# Patient Record
Sex: Female | Born: 1975 | Hispanic: Yes | Marital: Married | Smoking: Never smoker
Health system: Southern US, Community
[De-identification: ages and names within clinical notes are randomized; demographics above are authoritative.]

## PROBLEM LIST (undated history)

## (undated) ENCOUNTER — Inpatient Hospital Stay (HOSPITAL_COMMUNITY): Payer: Self-pay

## (undated) DIAGNOSIS — O139 Gestational [pregnancy-induced] hypertension without significant proteinuria, unspecified trimester: Secondary | ICD-10-CM

## (undated) DIAGNOSIS — Z789 Other specified health status: Secondary | ICD-10-CM

## (undated) DIAGNOSIS — O039 Complete or unspecified spontaneous abortion without complication: Secondary | ICD-10-CM

## (undated) HISTORY — DX: Gestational (pregnancy-induced) hypertension without significant proteinuria, unspecified trimester: O13.9

## (undated) HISTORY — DX: Complete or unspecified spontaneous abortion without complication: O03.9

## (undated) HISTORY — PX: DILATION AND EVACUATION: SHX1459

---

## 1999-01-26 DIAGNOSIS — O139 Gestational [pregnancy-induced] hypertension without significant proteinuria, unspecified trimester: Secondary | ICD-10-CM

## 2012-09-03 ENCOUNTER — Ambulatory Visit: Payer: Self-pay | Admitting: Gynecology

## 2017-06-08 ENCOUNTER — Encounter (HOSPITAL_COMMUNITY): Payer: Self-pay | Admitting: *Deleted

## 2017-06-08 ENCOUNTER — Other Ambulatory Visit: Payer: Self-pay

## 2017-06-08 ENCOUNTER — Emergency Department (HOSPITAL_COMMUNITY)
Admission: EM | Admit: 2017-06-08 | Discharge: 2017-06-09 | Disposition: A | Discharge status: Home / Self Care | Payer: Self-pay | Attending: Emergency Medicine | Admitting: Emergency Medicine

## 2017-06-08 DIAGNOSIS — Z3A01 Less than 8 weeks gestation of pregnancy: Secondary | ICD-10-CM | POA: Insufficient documentation

## 2017-06-08 DIAGNOSIS — R1030 Lower abdominal pain, unspecified: Secondary | ICD-10-CM | POA: Insufficient documentation

## 2017-06-08 DIAGNOSIS — O26891 Other specified pregnancy related conditions, first trimester: Secondary | ICD-10-CM | POA: Insufficient documentation

## 2017-06-08 DIAGNOSIS — R109 Unspecified abdominal pain: Secondary | ICD-10-CM

## 2017-06-08 LAB — CBC
HEMATOCRIT: 39.1 % (ref 36.0–46.0)
Hemoglobin: 12.9 g/dL (ref 12.0–15.0)
MCH: 30.2 pg (ref 26.0–34.0)
MCHC: 33 g/dL (ref 30.0–36.0)
MCV: 91.6 fL (ref 78.0–100.0)
Platelets: 290 10*3/uL (ref 150–400)
RBC: 4.27 MIL/uL (ref 3.87–5.11)
RDW: 13.7 % (ref 11.5–15.5)
WBC: 11.3 10*3/uL — ABNORMAL HIGH (ref 4.0–10.5)

## 2017-06-08 LAB — URINALYSIS, ROUTINE W REFLEX MICROSCOPIC
BACTERIA UA: NONE SEEN
Bilirubin Urine: NEGATIVE
Glucose, UA: NEGATIVE mg/dL
KETONES UR: NEGATIVE mg/dL
Nitrite: NEGATIVE
Protein, ur: NEGATIVE mg/dL
SPECIFIC GRAVITY, URINE: 1.009 (ref 1.005–1.030)
pH: 8 (ref 5.0–8.0)

## 2017-06-08 LAB — COMPREHENSIVE METABOLIC PANEL
ALBUMIN: 3.8 g/dL (ref 3.5–5.0)
ALT: 15 U/L (ref 14–54)
AST: 20 U/L (ref 15–41)
Alkaline Phosphatase: 54 U/L (ref 38–126)
Anion gap: 9 (ref 5–15)
BUN: 8 mg/dL (ref 6–20)
CHLORIDE: 105 mmol/L (ref 101–111)
CO2: 22 mmol/L (ref 22–32)
Calcium: 9 mg/dL (ref 8.9–10.3)
Creatinine, Ser: 0.65 mg/dL (ref 0.44–1.00)
GFR calc Af Amer: >60 mL/min (ref >60)
GFR calc non Af Amer: >60 mL/min (ref >60)
GLUCOSE: 152 mg/dL — AB (ref 65–99)
POTASSIUM: 3.8 mmol/L (ref 3.5–5.1)
Sodium: 136 mmol/L (ref 135–145)
Total Bilirubin: 0.6 mg/dL (ref 0.3–1.2)
Total Protein: 7 g/dL (ref 6.5–8.1)

## 2017-06-08 LAB — I-STAT BETA HCG BLOOD, ED (MC, WL, AP ONLY): I-stat hCG, quantitative: 1426.4 m[IU]/mL — ABNORMAL HIGH (ref <5)

## 2017-06-08 LAB — LIPASE, BLOOD: LIPASE: 25 U/L (ref 11–51)

## 2017-06-08 NOTE — ED Triage Notes (Signed)
The pt is c/o abd pain today  No nausea or vomiting  lmp last month

## 2017-06-08 NOTE — ED Triage Notes (Signed)
The pt now reports that her lmp was feb 12

## 2017-06-09 ENCOUNTER — Emergency Department (HOSPITAL_COMMUNITY): Payer: Self-pay

## 2017-06-09 LAB — ABO/RH: ABO/RH(D): O POS

## 2017-06-09 MED ORDER — ACETAMINOPHEN 500 MG PO TABS: 1000.0000 mg | ORAL_TABLET | Freq: Once | ORAL | Status: DC

## 2017-06-09 NOTE — ED Provider Notes (Signed)
[facility] EMERGENCY DEPARTMENT Provider Note   CSN: [id number] Arrival date & time: 06/08/17  2101     History   Chief Complaint Chief Complaint  Patient presents with  . Abdominal Pain    HPI Madison Contreras is a 42 y.o. female.  42 y/o [id number] female, currently pregnant, presents to the ED for complaints of abdominal pain.  She noticed lower abdominal and vaginal cramping yesterday morning.  This has been waxing and waning in severity and fairly constant.  Symptoms associated with vaginal bleeding which has slowed in addition to nausea without vomiting.  She denies taking any medications for her symptoms prior to arrival.  She is concerned about her abdominal discomfort and bleeding as she recently took a positive home pregnancy test.  She has not had any fevers, dysuria, hematuria.  The patient is not actively followed by an OB/GYN.  LMP 04/17/17  The history is provided by the patient. A language interpreter was used Multimedia programmer).    History reviewed. No pertinent past medical history.  There are no active problems to display for this patient.   History reviewed. No pertinent surgical history.   OB History   None      Home Medications    Prior to Admission medications   Not on File    Family History No family history on file.  Social History Social History   Tobacco Use  . Smoking status: Never Smoker  . Smokeless tobacco: Never Used  Substance Use Topics  . Alcohol use: Never    Frequency: Never  . Drug use: Not on file     Allergies   Patient has no known allergies.   Review of Systems Review of Systems Ten systems reviewed and are negative for acute change, except as noted in the HPI.    Physical Exam Updated Vital Signs BP 135/82 (BP Location: Right Arm)   Pulse 90   Temp 98.5 F (36.9 C) (Oral)   Resp 18   Ht 5' (1.524 m)   Wt 81.6 kg (180 lb)   LMP 04/20/2017   SpO2 100%   BMI 35.15 kg/m   Physical  Exam  Constitutional: She is oriented to person, place, and time. She appears well-developed and well-nourished. No distress.  Alert, nontoxic and in NAD  HENT:  Head: Normocephalic and atraumatic.  Eyes: Conjunctivae and EOM are normal. No scleral icterus.  Neck: Normal range of motion.  Cardiovascular: Normal rate, regular rhythm and intact distal pulses.  Pulmonary/Chest: Effort normal. No stridor. No respiratory distress.  Respirations even and unlabored  Abdominal:  Soft, obese abdomen with suprapubic TTP. No masses or peritoneal signs.  Musculoskeletal: Normal range of motion.  Neurological: She is alert and oriented to person, place, and time. She exhibits normal muscle tone. Coordination normal.  Skin: Skin is warm and dry. No rash noted. She is not diaphoretic. No erythema. No pallor.  Psychiatric: She has a normal mood and affect. Her behavior is normal.  Nursing note and vitals reviewed.    ED Treatments / Results  Labs (all labs ordered are listed, but only abnormal results are displayed) Labs Reviewed  COMPREHENSIVE METABOLIC PANEL - Abnormal; Notable for the following components:      Result Value   Glucose, Bld 152 (*)    All other components within normal limits  CBC - Abnormal; Notable for the following components:   WBC 11.3 (*)    All other components within normal limits  URINALYSIS, ROUTINE  W REFLEX MICROSCOPIC - Abnormal; Notable for the following components:   APPearance HAZY (*)    Hgb urine dipstick LARGE (*)    Leukocytes, UA SMALL (*)    Squamous Epithelial / LPF 6-30 (*)    All other components within normal limits  I-STAT BETA HCG BLOOD, ED ([facility], [facility], [facility] ONLY) - Abnormal; Notable for the following components:   I-stat hCG, quantitative 1,426.4 (*)    All other components within normal limits  LIPASE, BLOOD  ABO/RH    EKG None  Radiology US Ob Comp Less 14 Wks  Result Date: 06/09/2017 CLINICAL DATA:  Abdominal pain and vaginal bleeding for 1  day. Estimated gestational age by LMP is 7 weeks 4 days. Quantitative beta HCG is 1,426. EXAM: OBSTETRIC <14 WK [place] AND TRANSVAGINAL OB US TECHNIQUE: Both transabdominal and transvaginal ultrasound examinations were performed for complete evaluation of the gestation as well as the maternal uterus, adnexal regions, and pelvic cul-de-sac. Transvaginal technique was performed to assess early pregnancy. COMPARISON:  None. FINDINGS: Intrauterine gestational sac: A single intrauterine gestational sac is visualized. Yolk sac: Some internal echoes are present suggesting a possible yolk sac. Not definitively identified. Embryo:  Not Visualized. Cardiac Activity: Not Visualized. MSD: 7.1 mm   5 w   3 d Subchorionic hemorrhage:  None visualized. Maternal uterus/adnexae: Uterus is retroverted. No myometrial mass lesions identified. Both ovaries are visualized and appear normal. Corpus luteal cyst on the right ovary. No abnormal adnexal masses. Small amount of free fluid in the pelvis. IMPRESSION: Probable early intrauterine gestational sac, but no yolk sac, fetal pole, or cardiac activity yet visualized. Recommend follow-up quantitative B-HCG levels and follow-up US in 14 days to assess viability. This recommendation follows SRU consensus guidelines: Diagnostic Criteria for Nonviable Pregnancy Early in the First Trimester. Malva Limes Med 2013; 454:0981-19. Electronically Signed   By: Burman Nieves M.D.   On: 06/09/2017 06:27   US Ob Transvaginal  Result Date: 06/09/2017 CLINICAL DATA:  Abdominal pain and vaginal bleeding for 1 day. Estimated gestational age by LMP is 7 weeks 4 days. Quantitative beta HCG is 1,426. EXAM: OBSTETRIC <14 WK [place] AND TRANSVAGINAL OB US TECHNIQUE: Both transabdominal and transvaginal ultrasound examinations were performed for complete evaluation of the gestation as well as the maternal uterus, adnexal regions, and pelvic cul-de-sac. Transvaginal technique was performed to assess early pregnancy.  COMPARISON:  None. FINDINGS: Intrauterine gestational sac: A single intrauterine gestational sac is visualized. Yolk sac: Some internal echoes are present suggesting a possible yolk sac. Not definitively identified. Embryo:  Not Visualized. Cardiac Activity: Not Visualized. MSD: 7.1 mm   5 w   3 d Subchorionic hemorrhage:  None visualized. Maternal uterus/adnexae: Uterus is retroverted. No myometrial mass lesions identified. Both ovaries are visualized and appear normal. Corpus luteal cyst on the right ovary. No abnormal adnexal masses. Small amount of free fluid in the pelvis. IMPRESSION: Probable early intrauterine gestational sac, but no yolk sac, fetal pole, or cardiac activity yet visualized. Recommend follow-up quantitative B-HCG levels and follow-up US in 14 days to assess viability. This recommendation follows SRU consensus guidelines: Diagnostic Criteria for Nonviable Pregnancy Early in the First Trimester. Malva Limes Med 2013; 147:8295-62. Electronically Signed   By: Burman Nieves M.D.   On: 06/09/2017 06:27    Procedures Procedures (including critical care time)  Medications Ordered in ED Medications  acetaminophen (TYLENOL) tablet 1,000 mg (has no administration in time range)     Initial Impression / Assessment and  Plan / ED Course  I have reviewed the triage vital signs and the nursing notes.  Pertinent labs & imaging results that were available during my care of the patient were reviewed by me and considered in my medical decision making (see chart for details).     42 year old female presents for lower abdominal pain with slight vaginal bleeding which has slowed.  She has no signs of acute surgical abdomen on exam.  No fever.  Vitals stable.  Patient with mild leukocytosis in the setting of early pregnancy.  HCG 1426.  No evidence of associated urinary tract infection.  An ultrasound was obtained which shows a single intrauterine gestational sac.  No evidence of yolk sac or any  cardiac activity.  This is likely due to early gestation.  Unable to exclude miscarriage.  Ectopic thought less likely.  Adnexa appear normal.  Patient was given Tylenol in the emergency department for pain management.  She will be referred to the women's clinic for repeat hCG testing in 48 hours.  Have discussed the patient's imaging results and need for follow-up using [employer] video interpreter.  Patient verbalizes understanding of plan and workup today.  Return precautions discussed and provided. Patient discharged in stable condition with no unaddressed concerns.   Final Clinical Impressions(s) / ED Diagnoses   Final diagnoses:  Abdominal pain during pregnancy in first trimester    ED Discharge Orders    None       Antony MaduraHumes, Lynniah Janoski, PA-C 06/09/17 0724    Dione BoozeGlick, David, MD 06/09/17 [phone]

## 2017-06-09 NOTE — ED Notes (Signed)
Patient transported to Ultrasound 

## 2017-06-09 NOTE — ED Notes (Signed)
Pt departed in NAD, refused use of wheelchair.  

## 2017-06-09 NOTE — Discharge Instructions (Signed)
Su ecografa mostr un saco en el tero, pero es demasiado temprano para ver un feto. No est claro si continuar con un embarazo normal o si est experimentando un aborto espontneo temprano. Le recomendamos que vaya al [facility] en 2 das para que le realicen la prueba de embarazo en [place]. Puede tomar Tylenol para el dolor segn sea necesario. Puede regresar antes para el seguimiento si experimenta fiebre, empeoramiento del dolor, vmitos con incapacidad para comer o beber, o empeoramiento del sangrado vaginal.  Your ultrasound showed a sac in your uterus, but it is too early to see a fetus. It is unclear if you will continue with a normal pregnancy or if you are experiencing an early miscarriage. We advise you to go to [facility] in 2 days to have your blood pregnancy test redone. You may take Tylenol for pain as needed. You can return sooner for follow up if you experience fever, worsening pain, vomiting with inability to eat or drink, or worsening vaginal bleding.

## 2017-06-12 ENCOUNTER — Other Ambulatory Visit (INDEPENDENT_AMBULATORY_CARE_PROVIDER_SITE_OTHER): Payer: Self-pay | Admitting: *Deleted

## 2017-06-12 DIAGNOSIS — O3680X0 Pregnancy with inconclusive fetal viability, not applicable or unspecified: Secondary | ICD-10-CM

## 2017-06-12 DIAGNOSIS — O283 Abnormal ultrasonic finding on antenatal screening of mother: Secondary | ICD-10-CM

## 2017-06-12 LAB — HCG, QUANTITATIVE, PREGNANCY: hCG, Beta Chain, Quant, S: 2636 m[IU]/mL — ABNORMAL HIGH (ref <5)

## 2017-06-12 NOTE — Progress Notes (Signed)
Patient presented to clinic this morning for a beta hcg level (not stat). On checkout she c/o vaginal bleeding and pain and the phlebotomist approached me as to what f/u was needed. After I spoke with the patient via interpreter, I advised that the blood draw should be changed to a stat lab. Explained to patient the need to wait on results, understanding voiced. Results of quant reviewed with Steward DroneVeronica Rogers, CNM. Orders received for patient to return in Thurs for second stat beta and a f/u u/s. Spoke with patient about need for second u/s and bloodwork to see if the pregnancy is progressing normally. Advised patient that if her bleeding becomes heavy and/o pain worse she should come to MAU for evaluation. Understanding voiced.

## 2017-06-12 NOTE — Progress Notes (Signed)
I have reviewed the chart and agree with nursing staff's documentation of this patient's encounter. Agree with plan of care.   Sharyon CableRogers, Olga Bourbeau C, CNM 06/12/2017 5:35 PM

## 2017-06-14 ENCOUNTER — Encounter: Payer: Self-pay | Admitting: General Practice

## 2017-06-14 ENCOUNTER — Ambulatory Visit (HOSPITAL_COMMUNITY)
Admission: RE | Admit: 2017-06-14 | Discharge: 2017-06-14 | Disposition: A | Discharge status: Home / Self Care | Payer: Self-pay | Source: Ambulatory Visit | Attending: Certified Nurse Midwife | Admitting: Certified Nurse Midwife

## 2017-06-14 ENCOUNTER — Ambulatory Visit (INDEPENDENT_AMBULATORY_CARE_PROVIDER_SITE_OTHER): Payer: Self-pay | Admitting: General Practice

## 2017-06-14 DIAGNOSIS — O283 Abnormal ultrasonic finding on antenatal screening of mother: Secondary | ICD-10-CM | POA: Insufficient documentation

## 2017-06-14 DIAGNOSIS — Z3A01 Less than 8 weeks gestation of pregnancy: Secondary | ICD-10-CM | POA: Insufficient documentation

## 2017-06-14 DIAGNOSIS — O3680X0 Pregnancy with inconclusive fetal viability, not applicable or unspecified: Secondary | ICD-10-CM

## 2017-06-14 DIAGNOSIS — Z8759 Personal history of other complications of pregnancy, childbirth and the puerperium: Secondary | ICD-10-CM

## 2017-06-14 LAB — HCG, QUANTITATIVE, PREGNANCY: hCG, Beta Chain, Quant, S: 3136 m[IU]/mL — ABNORMAL HIGH (ref <5)

## 2017-06-14 MED ORDER — PRENATAL VITAMINS 0.8 MG PO TABS
1.0000 | ORAL_TABLET | Freq: Every day | ORAL | 12 refills | Status: DC
Start: 2017-06-14 — End: 2022-07-18

## 2017-06-14 NOTE — Progress Notes (Signed)
Madison Contreras here for stat bhcg & viability ultrasound today. Madison Contreras reports spotting and continued pelvic/back pain. Madison Contreras shows me, she is taking tylenol cold + flu severe for the pain because someone recommended it to her. Discussed with Madison Contreras she should discontinue medication due to containing phenylephrine. Recommended extra strength tylenol instead. Madison Contreras verbalized understanding & will return after ultrasound for all results. Madison Contreras used for interpreter.    Reviewed results with Madison Contreras who finds living IUP. Madison Contreras should begin prenatal care around 12 weeks. Informed Madison Contreras of results, reviewed dating & provided picture. Madison Contreras verbalized understanding. Asked Madison Contreras what medications/vitamins she is taking. Madison Contreras reports nothing other than tylenol. Recommended she begin PNV and she requests Rx. Rx sent in per protocol. Recommended she begin OB care. Madison Contreras verbalized understanding and had no questions.

## 2017-06-14 NOTE — Progress Notes (Signed)
Chart reviewed for nurse visit. Agree with plan of care.   Marylene LandKooistra, Darinda [place], CNM 06/14/2017 5:22 PM

## 2017-06-20 ENCOUNTER — Ambulatory Visit (INDEPENDENT_AMBULATORY_CARE_PROVIDER_SITE_OTHER): Payer: Self-pay | Admitting: General Practice

## 2017-06-20 VITALS — BP 115/71 | HR 68 | Ht 61.0 in | Wt 209.0 lb

## 2017-06-20 DIAGNOSIS — Z113 Encounter for screening for infections with a predominantly sexual mode of transmission: Secondary | ICD-10-CM

## 2017-06-20 DIAGNOSIS — O0990 Supervision of high risk pregnancy, unspecified, unspecified trimester: Secondary | ICD-10-CM | POA: Insufficient documentation

## 2017-06-20 DIAGNOSIS — Z23 Encounter for immunization: Secondary | ICD-10-CM

## 2017-06-20 DIAGNOSIS — Z8759 Personal history of other complications of pregnancy, childbirth and the puerperium: Secondary | ICD-10-CM

## 2017-06-20 DIAGNOSIS — O09529 Supervision of elderly multigravida, unspecified trimester: Secondary | ICD-10-CM | POA: Insufficient documentation

## 2017-06-20 DIAGNOSIS — O09521 Supervision of elderly multigravida, first trimester: Secondary | ICD-10-CM

## 2017-06-20 LAB — POCT URINALYSIS DIP (DEVICE)
BILIRUBIN URINE: NEGATIVE
Glucose, UA: NEGATIVE mg/dL
KETONES UR: NEGATIVE mg/dL
Nitrite: NEGATIVE
PH: 6 (ref 5.0–8.0)
Protein, ur: NEGATIVE mg/dL
Specific Gravity, Urine: 1.015 (ref 1.005–1.030)
Urobilinogen, UA: 0.2 mg/dL (ref 0.0–1.0)

## 2017-06-20 NOTE — Progress Notes (Signed)
Patient seen and assessed by nursing staff.  Agree with documentation and plan.  

## 2017-06-20 NOTE — Progress Notes (Signed)
New OB packet given to patient.

## 2017-06-21 LAB — GC/CHLAMYDIA PROBE AMP (~~LOC~~) NOT AT ARMC
Chlamydia: NEGATIVE
NEISSERIA GONORRHEA: NEGATIVE

## 2017-06-22 LAB — CULTURE, OB URINE

## 2017-06-22 LAB — OBSTETRIC PANEL, INCLUDING HIV
ANTIBODY SCREEN: NEGATIVE
Basophils Absolute: 0.1 10*3/uL (ref 0.0–0.2)
Basos: 1 %
EOS (ABSOLUTE): 0.2 10*3/uL (ref 0.0–0.4)
Eos: 2 %
HIV Screen 4th Generation wRfx: NONREACTIVE
Hematocrit: 39.2 % (ref 34.0–46.6)
Hemoglobin: 12.9 g/dL (ref 11.1–15.9)
Hepatitis B Surface Ag: NEGATIVE
IMMATURE GRANS (ABS): 0 10*3/uL (ref 0.0–0.1)
IMMATURE GRANULOCYTES: 0 %
LYMPHS: 30 %
Lymphocytes Absolute: 2.6 10*3/uL (ref 0.7–3.1)
MCH: 30.4 pg (ref 26.6–33.0)
MCHC: 32.9 g/dL (ref 31.5–35.7)
MCV: 93 fL (ref 79–97)
MONOS ABS: 0.6 10*3/uL (ref 0.1–0.9)
Monocytes: 7 %
NEUTROS PCT: 60 %
Neutrophils Absolute: 5.1 10*3/uL (ref 1.4–7.0)
PLATELETS: 346 10*3/uL (ref 150–379)
RBC: 4.24 x10E6/uL (ref 3.77–5.28)
RDW: 14.5 % (ref 12.3–15.4)
RPR Ser Ql: NONREACTIVE
Rh Factor: POSITIVE
Rubella Antibodies, IGG: 4.28 index (ref >0.99)
WBC: 8.5 10*3/uL (ref 3.4–10.8)

## 2017-06-22 LAB — HEMOGLOBINOPATHY EVALUATION
Ferritin: 140 ng/mL (ref 15–150)
HGB A2 QUANT: 2.3 % (ref 1.8–3.2)
HGB A: 97.7 % (ref 96.4–98.8)
HGB C: 0 %
HGB S: 0 %
HGB SOLUBILITY: NEGATIVE
HGB VARIANT: 0 %
Hgb F Quant: 0 % (ref 0.0–2.0)

## 2017-06-22 LAB — COMPREHENSIVE METABOLIC PANEL
ALBUMIN: 4.7 g/dL (ref 3.5–5.5)
ALK PHOS: 59 IU/L (ref 39–117)
ALT: 11 IU/L (ref 0–32)
AST: 14 IU/L (ref 0–40)
Albumin/Globulin Ratio: 1.7 (ref 1.2–2.2)
BUN / CREAT RATIO: 15 (ref 9–23)
BUN: 10 mg/dL (ref 6–20)
Bilirubin Total: 0.4 mg/dL (ref 0.0–1.2)
CO2: 22 mmol/L (ref 20–29)
CREATININE: 0.65 mg/dL (ref 0.57–1.00)
Calcium: 9.9 mg/dL (ref 8.7–10.2)
Chloride: 101 mmol/L (ref 96–106)
GFR, EST AFRICAN AMERICAN: 129 mL/min/{1.73_m2} (ref >59)
GFR, EST NON AFRICAN AMERICAN: 112 mL/min/{1.73_m2} (ref >59)
GLOBULIN, TOTAL: 2.7 g/dL (ref 1.5–4.5)
Glucose: 73 mg/dL (ref 65–99)
Potassium: 4.7 mmol/L (ref 3.5–5.2)
SODIUM: 139 mmol/L (ref 134–144)
TOTAL PROTEIN: 7.4 g/dL (ref 6.0–8.5)

## 2017-06-22 LAB — PROTEIN / CREATININE RATIO, URINE
Creatinine, Urine: 101.2 mg/dL
PROTEIN/CREAT RATIO: 194 mg/g{creat} (ref 0–200)
Protein, Ur: 19.6 mg/dL

## 2017-06-22 LAB — URINE CULTURE, OB REFLEX

## 2017-06-27 ENCOUNTER — Other Ambulatory Visit: Payer: Self-pay

## 2017-06-27 ENCOUNTER — Telehealth: Payer: Self-pay | Admitting: General Practice

## 2017-06-27 ENCOUNTER — Encounter (HOSPITAL_COMMUNITY): Payer: Self-pay | Admitting: *Deleted

## 2017-06-27 ENCOUNTER — Inpatient Hospital Stay (HOSPITAL_COMMUNITY)
Admission: AD | Admit: 2017-06-27 | Discharge: 2017-06-27 | Disposition: A | Discharge status: Home / Self Care | Payer: Self-pay | Source: Ambulatory Visit | Attending: Obstetrics & Gynecology | Admitting: Obstetrics & Gynecology

## 2017-06-27 ENCOUNTER — Inpatient Hospital Stay (HOSPITAL_COMMUNITY): Payer: Self-pay

## 2017-06-27 DIAGNOSIS — O021 Missed abortion: Secondary | ICD-10-CM | POA: Insufficient documentation

## 2017-06-27 DIAGNOSIS — O0991 Supervision of high risk pregnancy, unspecified, first trimester: Secondary | ICD-10-CM | POA: Insufficient documentation

## 2017-06-27 DIAGNOSIS — Z79899 Other long term (current) drug therapy: Secondary | ICD-10-CM | POA: Insufficient documentation

## 2017-06-27 DIAGNOSIS — Z3A01 Less than 8 weeks gestation of pregnancy: Secondary | ICD-10-CM | POA: Insufficient documentation

## 2017-06-27 DIAGNOSIS — Z6790 Unspecified blood type, Rh positive: Secondary | ICD-10-CM | POA: Insufficient documentation

## 2017-06-27 DIAGNOSIS — O4690 Antepartum hemorrhage, unspecified, unspecified trimester: Secondary | ICD-10-CM

## 2017-06-27 DIAGNOSIS — O0990 Supervision of high risk pregnancy, unspecified, unspecified trimester: Secondary | ICD-10-CM

## 2017-06-27 DIAGNOSIS — O09521 Supervision of elderly multigravida, first trimester: Secondary | ICD-10-CM | POA: Insufficient documentation

## 2017-06-27 LAB — CBC
HCT: 38.5 % (ref 36.0–46.0)
HEMOGLOBIN: 13.2 g/dL (ref 12.0–15.0)
MCH: 31.3 pg (ref 26.0–34.0)
MCHC: 34.3 g/dL (ref 30.0–36.0)
MCV: 91.2 fL (ref 78.0–100.0)
PLATELETS: 341 10*3/uL (ref 150–400)
RBC: 4.22 MIL/uL (ref 3.87–5.11)
RDW: 13.8 % (ref 11.5–15.5)
WBC: 11.9 10*3/uL — ABNORMAL HIGH (ref 4.0–10.5)

## 2017-06-27 LAB — HCG, QUANTITATIVE, PREGNANCY: hCG, Beta Chain, Quant, S: 2805 m[IU]/mL — ABNORMAL HIGH (ref <5)

## 2017-06-27 MED ORDER — OXYCODONE-ACETAMINOPHEN 5-325 MG PO TABS
1.0000 | ORAL_TABLET | Freq: Four times a day (QID) | ORAL | Status: DC | PRN
  Administered 2017-06-27: 1 via ORAL
  Filled 2017-06-27: qty 1

## 2017-06-27 MED ORDER — MISOPROSTOL 200 MCG PO TABS
800.0000 ug | ORAL_TABLET | Freq: Once | ORAL | Status: AC
  Administered 2017-06-27: 800 ug via RECTAL
  Filled 2017-06-27 (×2): qty 4

## 2017-06-27 MED ORDER — OXYCODONE-ACETAMINOPHEN 5-325 MG PO TABS: 1.0000 | ORAL_TABLET | Freq: Four times a day (QID) | ORAL | 0 refills | Status: DC | PRN

## 2017-06-27 MED ORDER — PROMETHAZINE HCL 25 MG PO TABS: 12.5000 mg | ORAL_TABLET | Freq: Four times a day (QID) | ORAL | 0 refills | Status: DC | PRN

## 2017-06-27 MED ORDER — IBUPROFEN 600 MG PO TABS: 600.0000 mg | ORAL_TABLET | Freq: Four times a day (QID) | ORAL | 0 refills | Status: DC | PRN

## 2017-06-27 MED ORDER — IBUPROFEN 600 MG PO TABS
600.0000 mg | ORAL_TABLET | Freq: Four times a day (QID) | ORAL | Status: DC | PRN
  Administered 2017-06-27: 600 mg via ORAL
  Filled 2017-06-27: qty 1

## 2017-06-27 NOTE — Telephone Encounter (Signed)
Called patient (with interpreter) to schedule lab visit and to see the provider.  Patient voiced understanding.

## 2017-06-27 NOTE — MAU Provider Note (Signed)
History     CSN: [id number]  Arrival date and time: 06/27/17 8295   First Provider Initiated Contact with Patient 06/27/17 [phone]      Chief Complaint  Patient presents with  . Vaginal Bleeding   [id number] @[redacted]w[redacted]d  here with VB. Reports spotting started 4 days ago then began bleeding today. She has used 2 pads this am. Also reports low abdominal cramping today. Intermittent and rates 10/10. Was seen earlier this month and had confirmed viable IUP.    OB History    Gravida  4   Para  2   Term  2   Preterm  0   AB  1   Living  2     SAB  1   TAB  0   Ectopic  0   Multiple  0   Live Births  2           Past Medical History:  Diagnosis Date  . Pregnancy induced hypertension     Past Surgical History:  Procedure Laterality Date  . DILATION AND EVACUATION      Family History  Problem Relation Age of Onset  . Hyperlipidemia Mother   . Hypertension Mother     Social History   Tobacco Use  . Smoking status: Never Smoker  . Smokeless tobacco: Never Used  Substance Use Topics  . Alcohol use: Never    Frequency: Never  . Drug use: Never    Allergies: No Known Allergies  Medications Prior to Admission  Medication Sig Dispense Refill Last Dose  . acetaminophen (TYLENOL) 500 MG tablet Take 1,000 mg by mouth every 6 (six) hours as needed for mild pain or headache.   06/26/2017 at Unknown time  . Prenatal Multivit-Min-Fe-FA (PRENATAL VITAMINS) 0.8 MG tablet Take 1 tablet by mouth daily. 30 tablet 12 06/26/2017 at Unknown time    Review of Systems  Gastrointestinal: Positive for abdominal pain.  Genitourinary: Positive for vaginal bleeding.   Physical Exam   Blood pressure (!) 142/74, pulse 90, temperature (!) 97.1 F (36.2 C), temperature source Oral, resp. rate 20, weight 207 lb 12 oz (94.2 kg), last menstrual period 04/17/2017, SpO2 98 %.  Physical Exam  Constitutional: She is oriented to person, place, and time. She appears well-developed and  well-nourished. No distress.  HENT:  Head: Normocephalic and atraumatic.  Neck: Normal range of motion.  Cardiovascular: Normal rate.  Respiratory: Effort normal. No respiratory distress.  GI: Soft. She exhibits no distension and no mass. There is no tenderness. There is no rebound and no guarding.  Genitourinary:  Genitourinary Comments: External: no lesions or erythema Vagina: rugated, pink, moist, small drk bloody discharge, cleared with 1 fox swab Cervix closed/long   Musculoskeletal: Normal range of motion.  Neurological: She is alert and oriented to person, place, and time.  Skin: Skin is warm and dry.  Psychiatric: She has a normal mood and affect.   Results for orders placed or performed during the hospital encounter of 06/27/17 (from the past 24 hour(s))  CBC     Status: Abnormal   Collection Time: 06/27/17  9:42 AM  Result Value Ref Range   WBC 11.9 (H) 4.0 - 10.5 K/uL   RBC 4.22 3.87 - 5.11 MIL/uL   Hemoglobin 13.2 12.0 - 15.0 g/dL   HCT 69.6 29.5 - 28.4 %   MCV 91.2 78.0 - 100.0 fL   MCH 31.3 26.0 - 34.0 pg   MCHC 34.3 30.0 - 36.0 g/dL   RDW 13.8  11.5 - 15.5 %   Platelets 341 150 - 400 K/uL  hCG, quantitative, pregnancy     Status: Abnormal   Collection Time: 06/27/17  9:42 AM  Result Value Ref Range   hCG, Beta Chain, Quant, S 2,805 (H) <5 mIU/mL   US Ob Transvaginal  Result Date: 06/27/2017 CLINICAL DATA:  Vaginal bleeding, fetal bradycardia on last ultrasound EXAM: TRANSVAGINAL OB ULTRASOUND TECHNIQUE: Transvaginal ultrasound was performed for complete evaluation of the gestation as well as the maternal uterus, adnexal regions, and pelvic cul-de-sac. COMPARISON:  06/14/2017 FINDINGS: Intrauterine gestational sac: Single Yolk sac:  Visualized. Embryo:  Visualized. Cardiac Activity: Not Visualized. CRL:   3.4 mm   5 w 6 d Subchorionic hemorrhage:  None visualized. Maternal uterus/adnexae: Bilateral ovaries are within normal limits, noting a right corpus luteal cyst.  Trace pelvic fluid. IMPRESSION: Single intrauterine gestational sac without cardiac activity, as described above. Findings meet definitive criteria for failed pregnancy, given the presence of cardiac activity on the prior study. This follows SRU consensus guidelines: Diagnostic Criteria for Nonviable Pregnancy Early in the First Trimester. Macy Mis J Med [phone]. Electronically Signed   By: Charline Bills M.D.   On: 06/27/2017 10:42   MAU Course  Procedures  MDM Labs and [place] ordered and reviewed. US shows failed pregnancy. Discussed options, pt and spouse prefer medical mngt.  Early Intrauterine Pregnancy Failure Protocol X  Documented intrauterine pregnancy failure less than or equal to [redacted] weeks   gestation  X  No serious current illness  X  Baseline Hgb greater than or equal to 10g/dl  X  Patient has easily accessible transportation to the hospital  X  Clear preference  X  Practitioner/physician deems patient reliable  X  Counseling by practitioner or physician  X  Patient education by RN  X  Consent form signed       Rho-Gam given by RN if indicated  X  Medication dispensed  X  Cytotec 800 mcg        Intravaginally by provider in MAU       Rectally by patient at home   X   Rectally by RN in MAU  __ Intravaginally by patient at home X   Ibuprofen 600 mg 1 tablet by mouth every 6 hours as needed - prescribed  X   Percocet 5/325 1-2 tabs every 6 hours as needed - prescribed  X   Phenergan 25 mg by mouth every 6 hours as needed for nausea - prescribed  Reviewed with pt cytotec procedure.  Pt verbalizes that she lives close to the hospital and has transportation readily available.  Pt appears reliable and verbalizes understanding and agrees with plan of care  Assessment and Plan   1. Missed abortion   2. Supervision of high risk pregnancy, antepartum   3. Multigravida of advanced maternal age in first trimester   4. Vaginal bleeding in pregnancy   5. Blood type, Rh positive     Discharge home Follow up in [facility] in 1 week for lab and 2 weeks for provider visit-message sent Bleeding return precautions  Allergies as of 06/27/2017   No Known Allergies     Medication List    TAKE these medications   acetaminophen 500 MG tablet Commonly known as:  TYLENOL Take 1,000 mg by mouth every 6 (six) hours as needed for mild pain or headache.   ibuprofen 600 MG tablet Commonly known as:  ADVIL,MOTRIN Take 1 tablet (600 mg total) by mouth every  6 (six) hours as needed for cramping.   oxyCODONE-acetaminophen 5-325 MG tablet Commonly known as:  PERCOCET/ROXICET Take 1-2 tablets by mouth every 6 (six) hours as needed for severe pain.   Prenatal Vitamins 0.8 MG tablet Take 1 tablet by mouth daily.   promethazine 25 MG tablet Commonly known as:  PHENERGAN Take 0.5-1 tablets (12.5-25 mg total) by mouth every 6 (six) hours as needed for nausea or vomiting.      Live interpreter present for encounter Donette LarryMelanie Huy Majid, CNM 06/27/2017, 12:25 PM

## 2017-06-27 NOTE — MAU Note (Signed)
Is pregnant and is bleeding quite a bit.  Been spotting for 4 days, this morning at 0600, became very heavy.  Bleeding like a period now. Yesterday afternoon, was having a lot of lower back pain, today, lower abd.

## 2017-07-03 ENCOUNTER — Other Ambulatory Visit: Payer: Self-pay

## 2017-07-03 DIAGNOSIS — O039 Complete or unspecified spontaneous abortion without complication: Secondary | ICD-10-CM

## 2017-07-04 ENCOUNTER — Other Ambulatory Visit: Payer: Self-pay

## 2017-07-05 ENCOUNTER — Other Ambulatory Visit: Payer: Self-pay

## 2017-07-05 DIAGNOSIS — O039 Complete or unspecified spontaneous abortion without complication: Secondary | ICD-10-CM

## 2017-07-06 LAB — BETA HCG QUANT (REF LAB): hCG Quant: 8 m[IU]/mL

## 2017-07-16 ENCOUNTER — Other Ambulatory Visit: Payer: Self-pay

## 2017-07-16 ENCOUNTER — Ambulatory Visit (INDEPENDENT_AMBULATORY_CARE_PROVIDER_SITE_OTHER): Payer: Self-pay | Admitting: Advanced Practice Midwife

## 2017-07-16 ENCOUNTER — Encounter: Payer: Self-pay | Admitting: Advanced Practice Midwife

## 2017-07-16 VITALS — BP 110/95 | HR 88 | Wt 210.0 lb

## 2017-07-16 DIAGNOSIS — O039 Complete or unspecified spontaneous abortion without complication: Secondary | ICD-10-CM

## 2017-07-16 NOTE — Progress Notes (Signed)
  Subjective:     Patient ID: Madison Contreras, female   DOB: , 42 y.o.   MRN: [id number]  Madison Contreras is a 42 y.o. [id number] who is SP SAB on 06/27/17. She was given cytotec while she was in MAU. She states that she has not had any bleeding or pain. Bleeding stopped approx 2 weeks ago. She is interested in another pregnancy as soon as possible. She had a miscarriage 12 years ago. Patient states that with her last pregnancy she had some bleeding, and [place] gave her medication "to make the baby stick". She is wondering if we have a medication similar to this here.   Vaginal Bleeding  The patient's primary symptoms include vaginal bleeding. The patient's pertinent negatives include no pelvic pain. This is a new problem. The current episode started 1 to 4 weeks ago. The problem occurs intermittently. The problem has been resolved. The patient is experiencing no pain. She is not pregnant. Pertinent negatives include no abdominal pain, chills, fever, nausea or vomiting. The vaginal discharge was normal. There has been no bleeding. Nothing aggravates the symptoms. She has tried nothing for the symptoms. She is sexually active. Contraceptive use: desires an pregnancy as soon as possible.  Her past medical history is significant for miscarriage.    [employer] int # [id number] used for visit   Review of Systems  Constitutional: Negative for chills and fever.  Gastrointestinal: Negative for abdominal pain, nausea and vomiting.  Genitourinary: Positive for vaginal bleeding. Negative for pelvic pain.       Objective:   Physical Exam  Constitutional: She is oriented to person, place, and time. She appears well-developed and well-nourished. No distress.  HENT:  Head: Normocephalic.  Cardiovascular: Normal rate.  Pulmonary/Chest: Effort normal.  Abdominal: Soft.  Neurological: She is alert and oriented to person, place, and time.  Psychiatric: She has a normal mood and affect.  Nursing note  and vitals reviewed.     Assessment:     1. SAB (spontaneous abortion)        Plan:     HCG today FU in 3 months for possible RE referral

## 2017-07-17 LAB — BETA HCG QUANT (REF LAB): hCG Quant: <1 m[IU]/mL

## 2017-07-19 ENCOUNTER — Encounter: Payer: Self-pay | Admitting: Family Medicine

## 2018-05-05 ENCOUNTER — Encounter (HOSPITAL_COMMUNITY): Payer: Self-pay

## 2018-08-09 IMAGING — US US OB TRANSVAGINAL
1 series · 15 of 28 positions shown · non-contrast
Comparison: 06/09/2017

CLINICAL DATA: Pregnancy of unknown anatomic location, assess
viability

EXAM:
TRANSVAGINAL OB ULTRASOUND
TECHNIQUE: Transvaginal ultrasound was performed for complete evaluation of the
gestation as well as the maternal uterus, adnexal regions, and
pelvic cul-de-sac.

[Series 1: us ob transvaginal · 32 acquisitions, 15 frames shown]
[im 1/32]
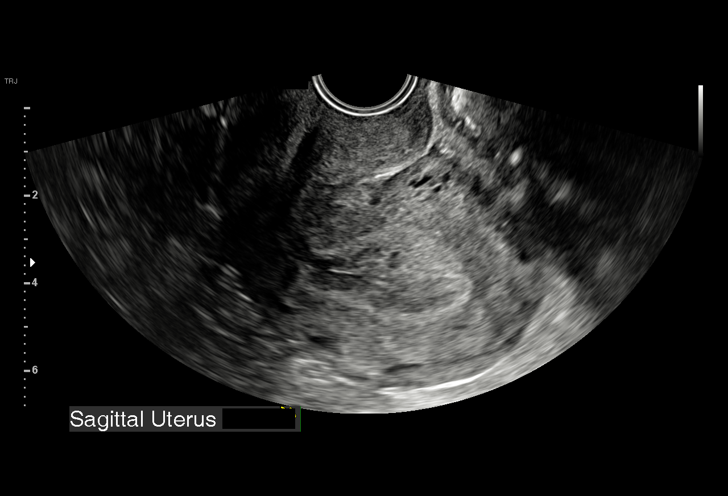
[im 3/32]
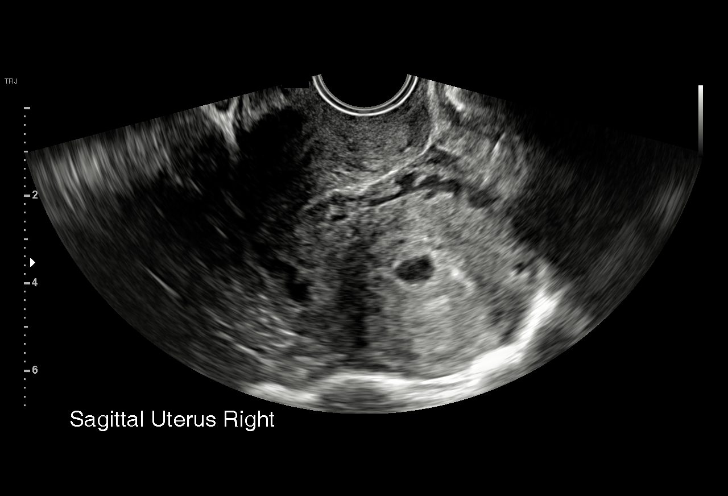
[im 5/32]
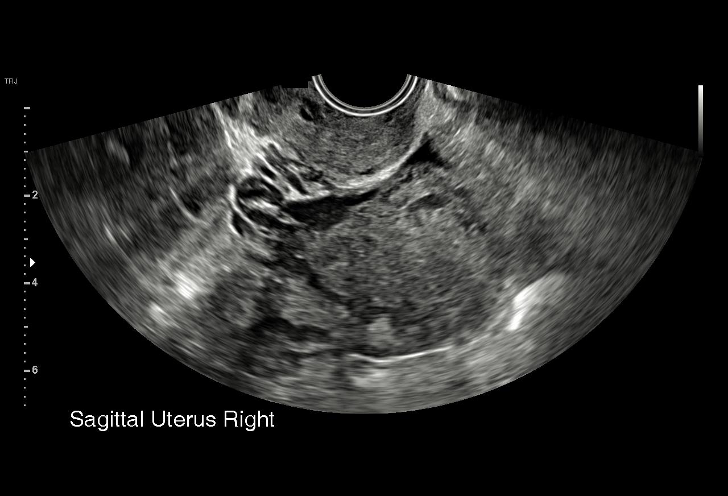
[im 7/32]
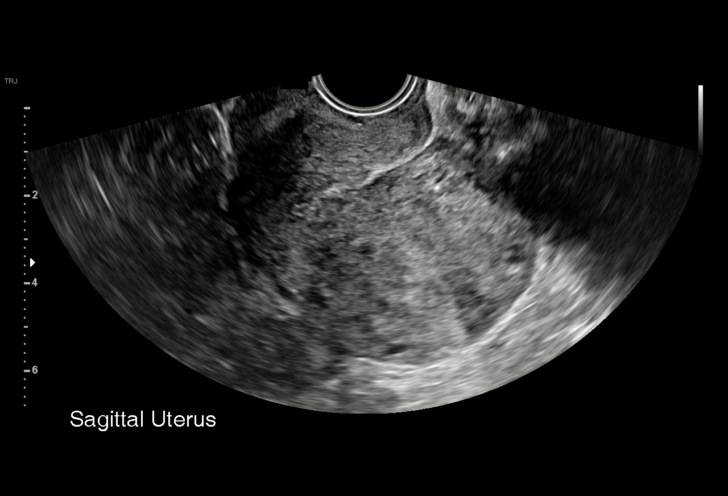
[im 10/32]
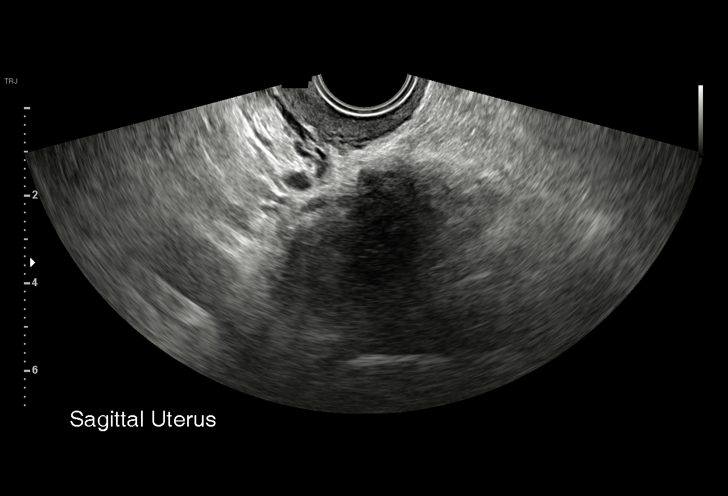
[im 12/32]
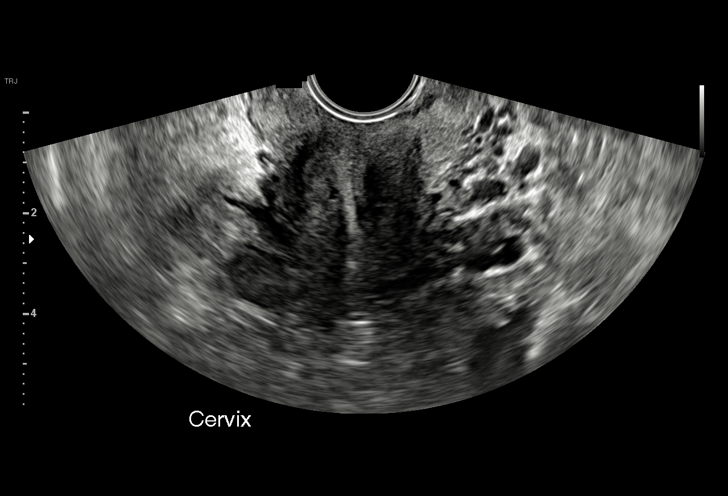
[im 14/32]
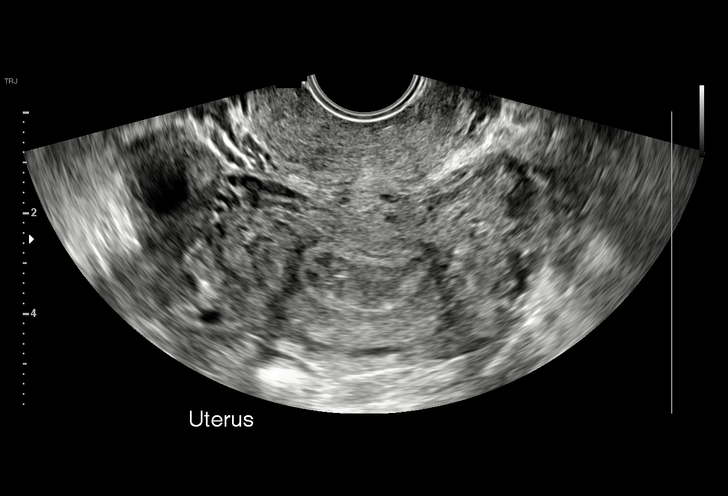
[im 17/32]
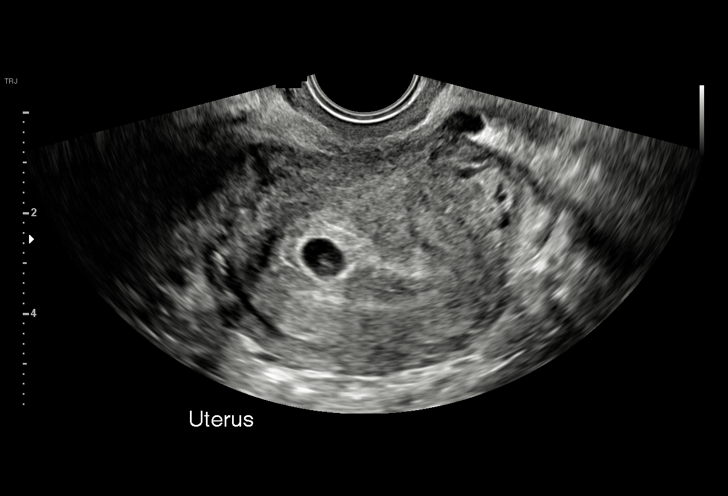
[im 18/32]
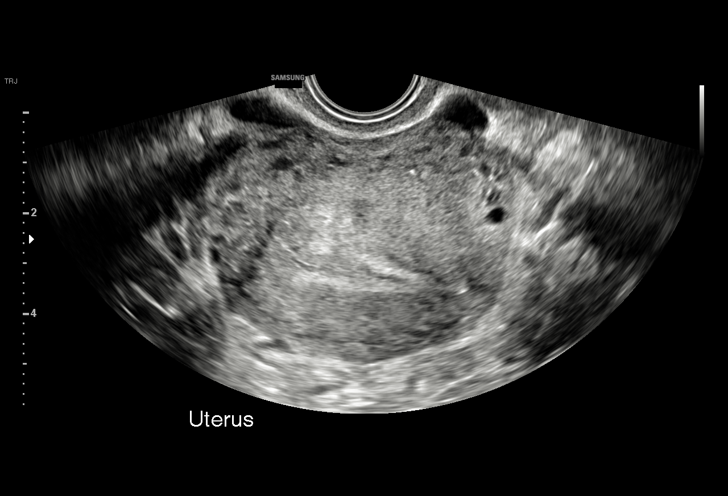
[im 20/32]
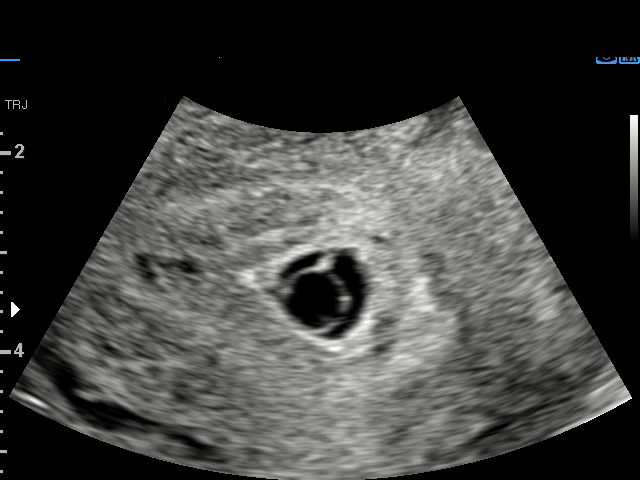
[im 22/32]
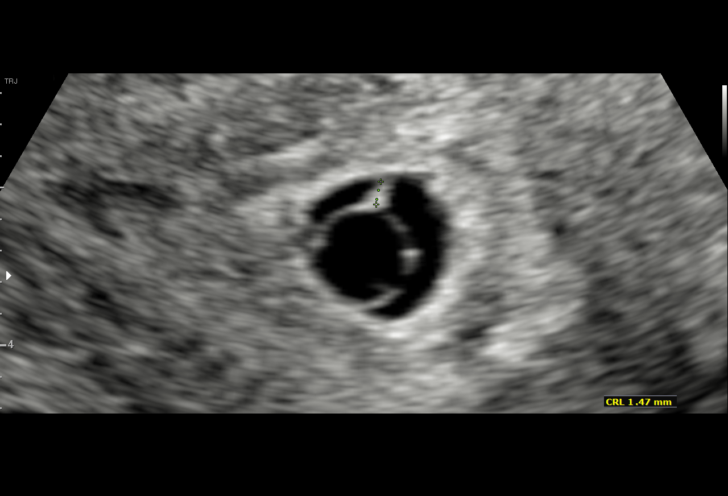
[im 25/32]
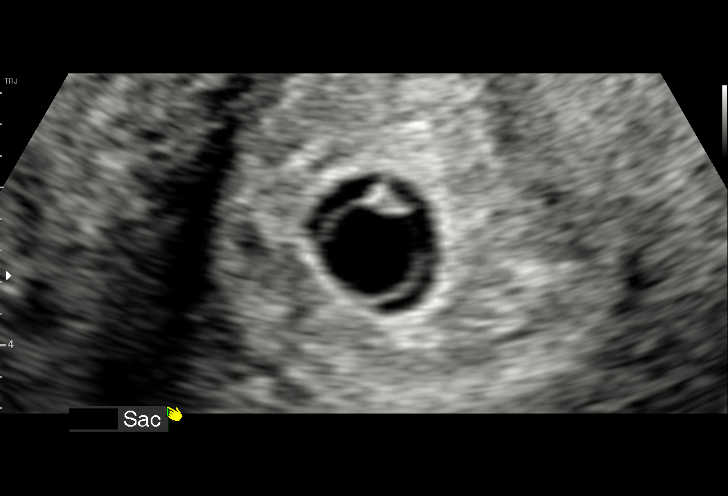
[im 27/32]
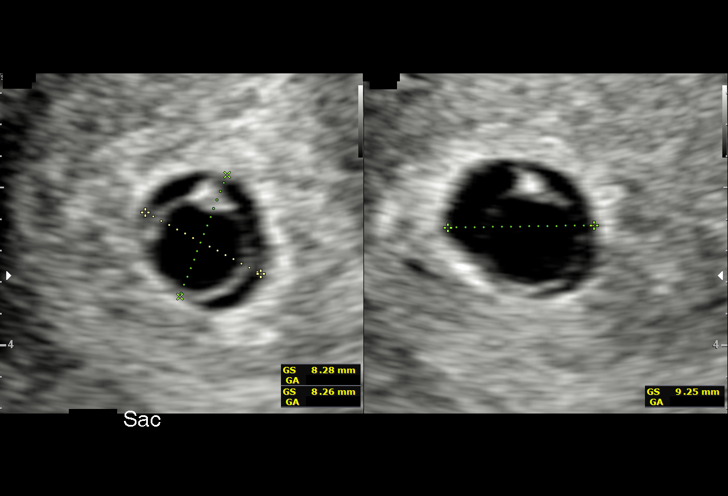
[im 29/32]
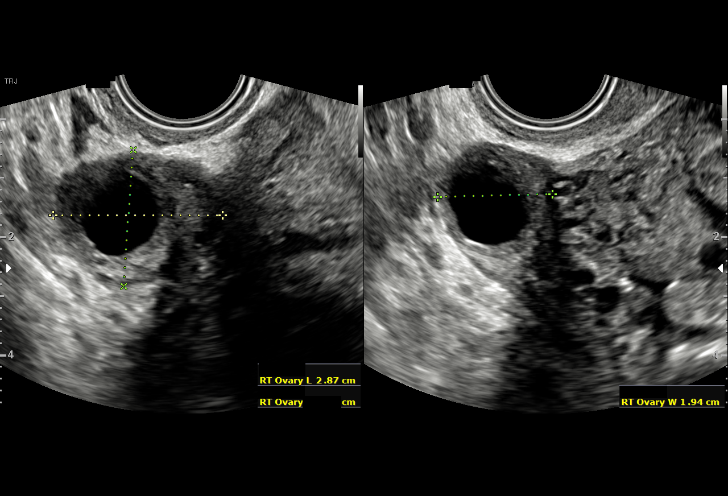
[im 32/32]
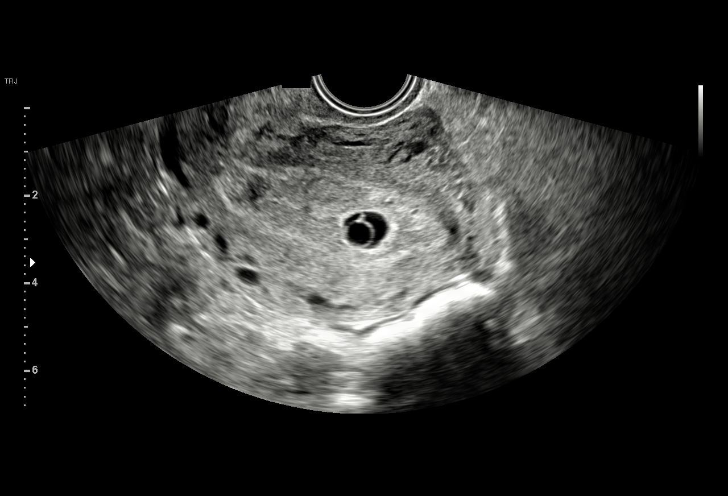

[15 of 28 positions shown; findings below may reference images not displayed]

FINDINGS: Intrauterine gestational sac: Present, single

Yolk sac:  Present

Embryo:  Present

Cardiac Activity: Present

Heart Rate: 95 bpm

MSD: 8.6 mm   5 w   4 d

CRL: 1.53 mm X w X d (out of range below scale) US EDC:

Subchorionic hemorrhage: None

Maternal uterus/adnexae:

RIGHT ovary normal size and morphology 2.9 x 2.3 x 1.9 cm.

LEFT ovary normal size and morphology 3.3 x 1.8 x 2.4 cm.

Trace free pelvic fluid without adnexal mass.

Retroflexed uterus.
IMPRESSION: Single live intrauterine gestation as above.

No acute abnormalities.

## 2018-08-16 ENCOUNTER — Encounter (HOSPITAL_COMMUNITY): Payer: Self-pay | Admitting: *Deleted

## 2018-08-16 ENCOUNTER — Inpatient Hospital Stay (HOSPITAL_COMMUNITY): Payer: Self-pay

## 2018-08-16 ENCOUNTER — Other Ambulatory Visit: Payer: Self-pay

## 2018-08-16 ENCOUNTER — Inpatient Hospital Stay (HOSPITAL_COMMUNITY)
Admission: AD | Admit: 2018-08-16 | Discharge: 2018-08-16 | Disposition: A | Discharge status: Home / Self Care | Payer: Self-pay | Attending: Obstetrics & Gynecology | Admitting: Obstetrics & Gynecology

## 2018-08-16 DIAGNOSIS — Z3A01 Less than 8 weeks gestation of pregnancy: Secondary | ICD-10-CM

## 2018-08-16 DIAGNOSIS — Z3A10 10 weeks gestation of pregnancy: Secondary | ICD-10-CM | POA: Insufficient documentation

## 2018-08-16 DIAGNOSIS — O209 Hemorrhage in early pregnancy, unspecified: Secondary | ICD-10-CM

## 2018-08-16 DIAGNOSIS — O208 Other hemorrhage in early pregnancy: Secondary | ICD-10-CM | POA: Insufficient documentation

## 2018-08-16 LAB — URINALYSIS, ROUTINE W REFLEX MICROSCOPIC
Bilirubin Urine: NEGATIVE
Glucose, UA: NEGATIVE mg/dL
Ketones, ur: NEGATIVE mg/dL
Nitrite: NEGATIVE
Protein, ur: NEGATIVE mg/dL
Specific Gravity, Urine: 1.009 (ref 1.005–1.030)
pH: 5 (ref 5.0–8.0)

## 2018-08-16 LAB — CBC
HCT: 38.9 % (ref 36.0–46.0)
Hemoglobin: 13 g/dL (ref 12.0–15.0)
MCH: 30.2 pg (ref 26.0–34.0)
MCHC: 33.4 g/dL (ref 30.0–36.0)
MCV: 90.3 fL (ref 80.0–100.0)
Platelets: 282 10*3/uL (ref 150–400)
RBC: 4.31 MIL/uL (ref 3.87–5.11)
RDW: 13.5 % (ref 11.5–15.5)
WBC: 9.9 10*3/uL (ref 4.0–10.5)
nRBC: 0 % (ref 0.0–0.2)

## 2018-08-16 LAB — WET PREP, GENITAL
Sperm: NONE SEEN
Trich, Wet Prep: NONE SEEN
Yeast Wet Prep HPF POC: NONE SEEN

## 2018-08-16 LAB — HCG, QUANTITATIVE, PREGNANCY: hCG, Beta Chain, Quant, S: 3440 m[IU]/mL — ABNORMAL HIGH (ref <5)

## 2018-08-16 LAB — POCT PREGNANCY, URINE: Preg Test, Ur: POSITIVE — AB

## 2018-08-16 NOTE — MAU Note (Signed)
Presents with c/o VB that began this morning.  Reports lower abdominal pain & burning.  LMP 06/06/2018.   Pregnancy verification letter from [facility].

## 2018-08-16 NOTE — MAU Provider Note (Signed)
History     CSN: [id number]  Arrival date and time: 08/16/18 1046   First Provider Initiated Contact with Patient 08/16/18 1133      Chief Complaint  Patient presents with  . Vaginal Bleeding   Madison Contreras is a 43 y.o. [id number] at [redacted]w[redacted]d who presents today with vaginal bleeding.   Vaginal Bleeding The patient's primary symptoms include pelvic pain and vaginal bleeding. This is a new problem. The current episode started yesterday. The problem occurs constantly. The problem has been gradually worsening. The problem affects both sides. Pertinent negatives include no chills, fever, nausea or vomiting. The vaginal discharge was bloody and brown. The vaginal bleeding is spotting. She has not been passing clots. She has not been passing tissue. Nothing aggravates the symptoms. She has tried nothing for the symptoms. She uses nothing for contraception. Her menstrual history has been regular (LMP 06/06/2018).    OB History    Gravida  5   Para  2   Term  2   Preterm  0   AB  2   Living  2     SAB  2   TAB  0   Ectopic  0   Multiple  0   Live Births  2           History reviewed. No pertinent past medical history.  Past Surgical History:  Procedure Laterality Date  . DILATION AND EVACUATION      Family History  Problem Relation Age of Onset  . Hyperlipidemia Mother   . Hypertension Mother   . Hypertension Father     Social History   Tobacco Use  . Smoking status: Never Smoker  . Smokeless tobacco: Never Used  Substance Use Topics  . Alcohol use: Never    Frequency: Never  . Drug use: Never    Allergies: No Known Allergies  Medications Prior to Admission  Medication Sig Dispense Refill Last Dose  . Prenatal Multivit-Min-Fe-FA (PRENATAL VITAMINS) 0.8 MG tablet Take 1 tablet by mouth daily. 30 tablet 12 08/15/2018 at 1800  . acetaminophen (TYLENOL) 500 MG tablet Take 1,000 mg by mouth every 6 (six) hours as needed for mild pain or headache.   08/14/2018 at  1900  . ibuprofen (ADVIL,MOTRIN) 600 MG tablet Take 1 tablet (600 mg total) by mouth every 6 (six) hours as needed for cramping. 30 tablet 0   . oxyCODONE-acetaminophen (PERCOCET/ROXICET) 5-325 MG tablet Take 1-2 tablets by mouth every 6 (six) hours as needed for severe pain. 10 tablet 0   . promethazine (PHENERGAN) 25 MG tablet Take 0.5-1 tablets (12.5-25 mg total) by mouth every 6 (six) hours as needed for nausea or vomiting. 30 tablet 0     Review of Systems  Constitutional: Negative for chills and fever.  Gastrointestinal: Negative for nausea and vomiting.  Genitourinary: Positive for pelvic pain and vaginal bleeding.   Physical Exam   Blood pressure (!) 141/75, pulse 91, temperature 98.4 F (36.9 C), temperature source Oral, resp. rate 20, height 5\' 4"  (1.626 m), weight 95.8 kg, last menstrual period 06/06/2018, SpO2 98 %, unknown if currently breastfeeding.  Physical Exam  Nursing note and vitals reviewed. Constitutional: She is oriented to person, place, and time. She appears well-developed and well-nourished. No distress.  HENT:  Head: Normocephalic.  Cardiovascular: Normal rate.  Respiratory: Effort normal.  GI: Soft. There is no abdominal tenderness. There is no rebound.  Genitourinary:    Genitourinary Comments:  External: no lesion Vagina: small amount of  brown discharge Cervix: pink, smooth, no CMT Uterus: NSSC Adnexa: NT    Neurological: She is alert and oriented to person, place, and time.  Skin: Skin is warm and dry.  Psychiatric: She has a normal mood and affect.   Results for orders placed or performed during the hospital encounter of 08/16/18 (from the past 24 hour(s))  Pregnancy, urine POC     Status: Abnormal   Collection Time: 08/16/18 11:07 AM  Result Value Ref Range   Preg Test, Ur POSITIVE (A) NEGATIVE  Urinalysis, Routine w reflex microscopic     Status: Abnormal   Collection Time: 08/16/18 11:13 AM  Result Value Ref Range   Color, Urine YELLOW  YELLOW   APPearance CLOUDY (A) CLEAR   Specific Gravity, Urine 1.009 1.005 - 1.030   pH 5.0 5.0 - 8.0   Glucose, UA NEGATIVE NEGATIVE mg/dL   Hgb urine dipstick LARGE (A) NEGATIVE   Bilirubin Urine NEGATIVE NEGATIVE   Ketones, ur NEGATIVE NEGATIVE mg/dL   Protein, ur NEGATIVE NEGATIVE mg/dL   Nitrite NEGATIVE NEGATIVE   Leukocytes,Ua SMALL (A) NEGATIVE   RBC / HPF 11-20 0 - 5 RBC/hpf   WBC, UA 11-20 0 - 5 WBC/hpf   Bacteria, UA RARE (A) NONE SEEN   Squamous Epithelial / LPF 11-20 0 - 5  Wet prep, genital     Status: Abnormal   Collection Time: 08/16/18 11:43 AM   Specimen: Cervix  Result Value Ref Range   Yeast Wet Prep HPF POC NONE SEEN NONE SEEN   Trich, Wet Prep NONE SEEN NONE SEEN   Clue Cells Wet Prep HPF POC PRESENT (A) NONE SEEN   WBC, Wet Prep HPF POC MANY (A) NONE SEEN   Sperm NONE SEEN   CBC     Status: None   Collection Time: 08/16/18 11:46 AM  Result Value Ref Range   WBC 9.9 4.0 - 10.5 K/uL   RBC 4.31 3.87 - 5.11 MIL/uL   Hemoglobin 13.0 12.0 - 15.0 g/dL   HCT 69.638.9 29.536.0 - 28.446.0 %   MCV 90.3 80.0 - 100.0 fL   MCH 30.2 26.0 - 34.0 pg   MCHC 33.4 30.0 - 36.0 g/dL   RDW 13.213.5 44.011.5 - 10.215.5 %   Platelets 282 150 - 400 K/uL   nRBC 0.0 0.0 - 0.2 %  hCG, quantitative, pregnancy     Status: Abnormal   Collection Time: 08/16/18 11:46 AM  Result Value Ref Range   hCG, Beta Chain, Quant, S 3,440 (H) <5 mIU/mL   [place] Ob Less Than 14 Weeks With Ob Transvaginal  Result Date: 08/16/2018 CLINICAL DATA:  Vaginal bleeding in 1st trimester pregnancy. EXAM: OBSTETRIC <14 WK US AND TRANSVAGINAL OB US TECHNIQUE: Both transabdominal and transvaginal ultrasound examinations were performed for complete evaluation of the gestation as well as the maternal uterus, adnexal regions, and pelvic cul-de-sac. Transvaginal technique was performed to assess early pregnancy. COMPARISON:  None. FINDINGS: Intrauterine gestational sac: Single Yolk sac:  Not Visualized. Embryo:  Visualized. Cardiac  Activity: Not Visualized. CRL:  4 mm   6 w   1 d                  US EDC: 04/10/2019 Subchorionic hemorrhage:  Small to moderate subchorionic hemorrhage. Maternal uterus/adnexae: Retroflexed uterus. Normal appearance of both ovaries. No mass or abnormal free fluid identified. IMPRESSION: Findings are suspicious but not yet definitive for failed pregnancy. Recommend follow-up US in 7 days for definitive diagnosis. This recommendation  follows SRU consensus guidelines: Diagnostic Criteria for Nonviable Pregnancy Early in the First Trimester. Malva Limes Engl J Med 2013; 147:8295-62; 369:1443-51. Small to moderate subchorionic hemorrhage. Electronically Signed   By: Myles RosenthalJohn  Stahl M.D.   On: 08/16/2018 12:47    MAU Course  Procedures  MDM  Assessment and Plan   1. [redacted] weeks gestation of pregnancy   2. Vaginal bleeding in pregnancy, first trimester    DC home 1st/2nd/3rd Trimester precautions  Bleeding precautions RX: none  Return to MAU as needed FU US in one week to confirm viability  Follow-up Information    [facility].   Specialty: Radiology Contact information: [address], [address] [id number] [address] [place] [phone]          Thressa ShellerHeather Hogan DNP, CNM  08/16/18  1:16 PM

## 2018-08-16 NOTE — Discharge Instructions (Signed)
Hemorragia vaginal durante el primer trimestre de embarazo  Vaginal Bleeding During Pregnancy, First Trimester    Durante los primeros meses del embarazo es relativamente frecuente que se presente una pequeña hemorragia (pérdidas) proveniente de la vagina. Normalmente esto se detiene por sí solo. Estas hemorragias o pérdidas tienen diversas causas al inicio del embarazo. Algunas pueden estar relacionadas al embarazo y otras no. En muchos casos, la hemorragia es normal y no es un problema. Sin embargo, la hemorragia también puede ser un signo de algo grave. Debe informar a su médico de inmediato si tiene alguna hemorragia vaginal.  Algunas causas posibles de hemorragia vaginal durante el primer trimestre incluyen lo siguiente:  · Infección o inflamación del cuello uterino.  · Crecimientos anormales (pólipos) en el cuello uterino.  · Aborto espontáneo o amenaza de aborto espontáneo.  · Tejido fetal que se desarrolla fuera del útero (embarazo ectópico).  · Una masa de tejido que crece en el útero debido a una mala fertilización del óvulo (embarazo molar).  Siga estas indicaciones en su casa:  Actividad  · Siga las indicaciones del médico respecto de las restricciones en las actividades. Pregunte qué actividades son seguras para usted.  · Si es necesario, organícese para que alguien la ayude con las actividades cotidianas.  · No tenga relaciones sexuales ni orgasmos hasta que su médico le diga que es seguro.  Instrucciones generales  · Tome los medicamentos de venta libre y los recetados solamente como se lo haya indicado el médico.  · Esté atenta a cualquier cambio en los síntomas.  · No use tampones ni se haga duchas vaginales.  · Lleve un registro de la cantidad de apósitos higiénicos que usa por día, la frecuencia con la que los cambia y cuán empapados (saturados) están.  · Si elimina tejido por la vagina, guárdelo para mostrárselo al médico.  · Concurra a todas las visitas de control como se lo haya indicado el  médico. Esto es importante.  Comuníquese con un médico si:  · Tiene un sangrado vaginal en cualquier momento del embarazo.  · Tiene calambres o dolores de parto.  · Tiene fiebre.  Solicite ayuda de inmediato si:  · Tiene cólicos intensos en la espalda o el abdomen.  · Elimina coágulos grandes o gran cantidad de tejido por la vagina.  · La hemorragia aumenta.  · Se siente mareada, débil, o se desmaya.  · Tiene escalofríos.  · Tiene una pérdida importante o sale líquido a borbotones por la vagina.  Resumen  · Durante los primeros meses del embarazo es relativamente frecuente que se presente una pequeña hemorragia (pérdidas) proveniente de la vagina.  · Estas hemorragias o pérdidas tienen diversas causas al inicio del embarazo.  · Debe informar a su médico de inmediato si tiene alguna hemorragia vaginal.  Esta información no tiene como fin reemplazar el consejo del médico. Asegúrese de hacerle al médico cualquier pregunta que tenga.  Document Released: 11/30/2004 Document Revised: 11/16/2016 Document Reviewed: 11/16/2016  Elsevier Interactive Patient Education © 2019 Elsevier Inc.

## 2018-08-17 ENCOUNTER — Other Ambulatory Visit: Payer: Self-pay

## 2018-08-17 ENCOUNTER — Inpatient Hospital Stay (HOSPITAL_COMMUNITY)
Admission: AD | Admit: 2018-08-17 | Discharge: 2018-08-17 | Disposition: A | Discharge status: Home / Self Care | Payer: Self-pay | Attending: Obstetrics and Gynecology | Admitting: Obstetrics and Gynecology

## 2018-08-17 ENCOUNTER — Encounter (HOSPITAL_COMMUNITY): Payer: Self-pay | Admitting: *Deleted

## 2018-08-17 ENCOUNTER — Inpatient Hospital Stay (HOSPITAL_COMMUNITY): Payer: Self-pay

## 2018-08-17 DIAGNOSIS — O034 Incomplete spontaneous abortion without complication: Secondary | ICD-10-CM | POA: Insufficient documentation

## 2018-08-17 DIAGNOSIS — Z3A10 10 weeks gestation of pregnancy: Secondary | ICD-10-CM | POA: Insufficient documentation

## 2018-08-17 DIAGNOSIS — O209 Hemorrhage in early pregnancy, unspecified: Secondary | ICD-10-CM

## 2018-08-17 HISTORY — DX: Other specified health status: Z78.9

## 2018-08-17 MED ORDER — IBUPROFEN 600 MG PO TABS: 600.0000 mg | ORAL_TABLET | Freq: Four times a day (QID) | ORAL | 0 refills | Status: DC | PRN

## 2018-08-17 MED ORDER — OXYCODONE-ACETAMINOPHEN 5-325 MG PO TABS: 1.0000 | ORAL_TABLET | ORAL | 0 refills | Status: DC | PRN

## 2018-08-17 MED ORDER — OXYCODONE-ACETAMINOPHEN 5-325 MG PO TABS
2.0000 | ORAL_TABLET | Freq: Once | ORAL | Status: AC
  Administered 2018-08-17: 2 via ORAL
  Filled 2018-08-17: qty 2

## 2018-08-17 MED ORDER — MISOPROSTOL 200 MCG PO TABS: 800.0000 ug | ORAL_TABLET | Freq: Once | ORAL | 0 refills | Status: DC

## 2018-08-17 MED ORDER — KETOROLAC TROMETHAMINE 60 MG/2ML IM SOLN
60.0000 mg | Freq: Once | INTRAMUSCULAR | Status: AC
  Administered 2018-08-17: 60 mg via INTRAMUSCULAR
  Filled 2018-08-17: qty 2

## 2018-08-17 NOTE — Discharge Instructions (Signed)
Pick up your medicines from [facility] on [address]. Do not get medications at your other [facility] location Expect the clinic to call you.  You will need a lab appointment the week of June 22 and an appointment with the MD the week of June 29.

## 2018-08-17 NOTE — MAU Provider Note (Signed)
History     CSN: [id number]  Arrival date and time: 08/17/18 2671   First Provider Initiated Contact with Patient 08/17/18 2025      Chief Complaint  Patient presents with  . Abdominal Pain  . Vaginal Bleeding   HPI Hema Rojek 43 y.o. [redacted]w[redacted]d  Began having heavier vaginal bleeding today at home that included having clots.  Having lots of cramping and large pad is 50% saturated.  Blood on clothing and on shoes.  Was seen in MAU yesterday and diagnosed with small to moderate subchorionic hemorrhage and possibly a nonviable pregnancy.  Was to be scheduled for OP ultrasound next week.  Quant yesterday was 3440.  OB History    Gravida  5   Para  2   Term  2   Preterm  0   AB  2   Living  2     SAB  2   TAB  0   Ectopic  0   Multiple  0   Live Births  2           Past Medical History:  Diagnosis Date  . Medical history non-contributory     Past Surgical History:  Procedure Laterality Date  . DILATION AND EVACUATION      Family History  Problem Relation Age of Onset  . Hyperlipidemia Mother   . Hypertension Mother   . Hypertension Father     Social History   Tobacco Use  . Smoking status: Never Smoker  . Smokeless tobacco: Never Used  Substance Use Topics  . Alcohol use: Never    Frequency: Never  . Drug use: Never    Allergies: No Known Allergies  Medications Prior to Admission  Medication Sig Dispense Refill Last Dose  . acetaminophen (TYLENOL) 500 MG tablet Take 1,000 mg by mouth every 6 (six) hours as needed for mild pain or headache.   08/17/2018 at Unknown time  . Prenatal Multivit-Min-Fe-FA (PRENATAL VITAMINS) 0.8 MG tablet Take 1 tablet by mouth daily. 30 tablet 12 08/17/2018 at Unknown time    Review of Systems  Constitutional: Negative for fever.  Respiratory: Negative for cough and shortness of breath.   Gastrointestinal: Positive for abdominal pain.  Genitourinary: Positive for vaginal bleeding. Negative for dysuria and vaginal  discharge.   Physical Exam   Blood pressure (!) 146/87, pulse 85, temperature 98.6 F (37 C), temperature source Oral, resp. rate 19, last menstrual period 06/06/2018, unknown if currently breastfeeding.  Physical Exam  Nursing note and vitals reviewed. Constitutional: She is oriented to person, place, and time. She appears well-developed and well-nourished.  HENT:  Head: Normocephalic.  Eyes: EOM are normal.  Neck: Neck supple.  GI: Soft. There is abdominal tenderness. There is no rebound and no guarding.  Genitourinary:    Genitourinary Comments: Speculum exam: Vagina - Vagina completely filled with clots and blood Cervix - hard to visualize completely, clot in cervix teased out Bimanual exam: cervix open 1 cm Chaperone present for exam.    Musculoskeletal: Normal range of motion.  Neurological: She is alert and oriented to person, place, and time.  Skin: Skin is warm and dry.  Psychiatric: She has a normal mood and affect.    MAU Course  Procedures CLINICAL DATA:  Heavy vaginal bleeding  EXAM: TRANSVAGINAL OB ULTRASOUND  TECHNIQUE: Transvaginal ultrasound was performed for complete evaluation of the gestation as well as the maternal uterus, adnexal regions, and pelvic cul-de-sac.  COMPARISON:  08/16/2018  FINDINGS: Intrauterine gestational sac:  Elongated cystic area seen within the cervix. This may reflect abortion in progress.  Yolk sac:  None  Embryo:  None  Cardiac Activity:  Heart Rate:  bpm  MSD: 6.3 mm   5 w   2 d  CRL: Mm  w  d                  US EDC:  Subchorionic hemorrhage:  None visualized.  Maternal uterus/adnexae: Endometrium appears thickened, measuring up to 2.4 cm. No adnexal mass or free fluid.  IMPRESSION: Elongated cystic area within the cervix which could reflect the previously seen endometrial yolk sac or conceivably could reflect nabothian cyst. No yolk sac visualized currently within the endometrium as seen on  yesterday's study. Findings concerning for abortion in progress.  MDM Blood type O positive; HGB was 13.0 yesterday Due to bleeding, will get transvagional US today. Client in lots of pain - given Toradol 60 mg IM and Percocet 5/325 mg x 2 tablets Discussed with client that she currently has a miscarriage in process.  The cervix is open and there is still some tissue that is in the cervix and will be expelled. Reviewed the follow up plan for labs in one week and provider visit (MD as this is the 3rd miscarriage for this client). BP is in normal range after ultrasound.  Assessment and Plan  Incomplete Miscarriage  Plan Medications for cytotec, percocet (12 tablets) and Ibuprofen sent to her pharmacy. Sent to a closed pharmacy by mistake so client advised to pick up medications only from the 24 hour pharmacy. Advised that the clinic will call to schedule appointments and message sent to the clinic to schedule - 1 week for BHCG and 2 weeks for provider (MD) visit. Husband accompanying her tonight. See AVS for additional instructions given to client.   Currie Pariserri L Burleson 08/17/2018, 8:39 PM

## 2018-08-17 NOTE — MAU Note (Addendum)
Pt reports to MAU c/o heavy vaginal bleeding and abdominal cramping. Pt states she has worn 3 pads today and now she is bleeding more. Pt states the heavy bleeding started 30 min ago and has passed 3 clots. No dizziness or lightheadedness. Pt states the pain is sharp and constant and is a 10/10.

## 2018-08-19 LAB — GC/CHLAMYDIA PROBE AMP (~~LOC~~) NOT AT ARMC
Chlamydia: NEGATIVE
Neisseria Gonorrhea: NEGATIVE

## 2018-08-23 ENCOUNTER — Other Ambulatory Visit: Payer: Self-pay | Admitting: *Deleted

## 2018-08-23 ENCOUNTER — Telehealth: Payer: Self-pay | Admitting: Family Medicine

## 2018-08-23 DIAGNOSIS — O034 Incomplete spontaneous abortion without complication: Secondary | ICD-10-CM

## 2018-08-23 NOTE — Telephone Encounter (Signed)
Spoke with patient with Spanish interpreter ID # [id number] about her appointment on 6/22 @ 1:30. Patient was instructed to wear a face mask and no visitors are allowed with her. Patient was screened for covid symptoms and denied having any

## 2018-08-26 ENCOUNTER — Other Ambulatory Visit: Payer: Self-pay

## 2018-08-26 DIAGNOSIS — O034 Incomplete spontaneous abortion without complication: Secondary | ICD-10-CM

## 2018-08-27 LAB — BETA HCG QUANT (REF LAB): hCG Quant: 17 m[IU]/mL

## 2018-09-03 ENCOUNTER — Ambulatory Visit (INDEPENDENT_AMBULATORY_CARE_PROVIDER_SITE_OTHER): Payer: Self-pay

## 2018-09-03 ENCOUNTER — Other Ambulatory Visit: Payer: Self-pay

## 2018-09-03 ENCOUNTER — Ambulatory Visit (HOSPITAL_COMMUNITY)
Admission: RE | Admit: 2018-09-03 | Discharge: 2018-09-03 | Disposition: A | Discharge status: Home / Self Care | Payer: Self-pay | Source: Ambulatory Visit | Attending: Advanced Practice Midwife | Admitting: Advanced Practice Midwife

## 2018-09-03 ENCOUNTER — Telehealth: Payer: Self-pay | Admitting: Advanced Practice Midwife

## 2018-09-03 DIAGNOSIS — O039 Complete or unspecified spontaneous abortion without complication: Secondary | ICD-10-CM

## 2018-09-03 DIAGNOSIS — Z3A01 Less than 8 weeks gestation of pregnancy: Secondary | ICD-10-CM | POA: Insufficient documentation

## 2018-09-03 DIAGNOSIS — O209 Hemorrhage in early pregnancy, unspecified: Secondary | ICD-10-CM | POA: Insufficient documentation

## 2018-09-03 NOTE — Progress Notes (Signed)
Pt here today for [place] results.  US showed miscarriage however pt's beta levels was a 17 on 08/26/18 from 3440.  With  Pt asked why she is continuing to have miscarriages.  I explained to the pt that we can refer her to Fertility specialist however it can be expensive.  Pt stated that she wanted the information.  Information to [employer] given.  Tiana to fax referral to [facility] for pt.  Notified Marcille Buffy, CNM who recommends pt get another non stat beta drawn today to make sure beta levels are at zero.  Pt verbalized understanding.   Mel Almond, RN 09/03/18

## 2018-09-03 NOTE — Telephone Encounter (Signed)
Charlett Blake called previously.

## 2018-09-03 NOTE — Progress Notes (Signed)
I have reviewed the chart and agree with nursing staff's documentation of this patient's encounter.  Marcille Buffy, CNM 09/03/2018 5:10 PM

## 2018-09-04 ENCOUNTER — Encounter: Payer: Self-pay | Admitting: Obstetrics & Gynecology

## 2018-09-04 ENCOUNTER — Ambulatory Visit (INDEPENDENT_AMBULATORY_CARE_PROVIDER_SITE_OTHER): Payer: Self-pay | Admitting: Obstetrics & Gynecology

## 2018-09-04 DIAGNOSIS — O039 Complete or unspecified spontaneous abortion without complication: Secondary | ICD-10-CM

## 2018-09-04 DIAGNOSIS — N96 Recurrent pregnancy loss: Secondary | ICD-10-CM

## 2018-09-04 LAB — BETA HCG QUANT (REF LAB): hCG Quant: 2 m[IU]/mL

## 2018-09-04 NOTE — Progress Notes (Signed)
I connected with  Oren Section on 09/04/18 at  3:35 PM EDT by telephone and verified that I am speaking with the correct person using two identifiers.   I discussed the limitations, risks, security and privacy concerns of performing an evaluation and management service by telephone and the availability of in person appointments. I also discussed with the patient that there may be a patient responsible charge related to this service. The patient expressed understanding and agreed to proceed.  Madison Contreras, [place] 09/04/2018  3:43 PM   Madison Contreras [place] interperter

## 2018-09-04 NOTE — Progress Notes (Signed)
TELEHEALTH GYNECOLOGY VIRTUAL VIDEO VISIT ENCOUNTER NOTE  [facility]   I connected with Madison Contreras on 09/04/18 at  3:35 PM EDT by WebEx Video Encounter at home and verified that I am speaking with the correct person using two identifiers. Patient is Spanish-speaking only, Spanish interpreter present for this encounter.   I discussed the limitations, risks, security and privacy concerns of performing an evaluation and management service by telephone and the availability of in person appointments. I also discussed with the patient that there may be a patient responsible charge related to this service. The patient expressed understanding and agreed to proceed.   History:  Madison Reekelma Beadle is a 43 y.o. [phone] [id number] female being evaluated today for follow up after recently completed miscarriage. She denies any abnormal vaginal discharge, bleeding, pelvic pain or other concerns.       Past Medical History:  Diagnosis Date   Medical history non-contributory    Past Surgical History:  Procedure Laterality Date   DILATION AND EVACUATION     The following portions of the patient's history were reviewed and updated as appropriate: allergies, current medications, past family history, past medical history, past social history, past surgical history and problem list.   Health Maintenance:  Normal pap in 2018.  Review of Systems:  Pertinent items noted in HPI and remainder of comprehensive ROS otherwise negative.  Physical Exam:   General:  Alert, oriented and cooperative. Patient appears to be in no acute distress.  Mental Status: Normal mood and affect. Normal behavior. Normal judgment and thought content.   Respiratory: Normal respiratory effort, no problems with respiration noted  Rest of physical exam deferred due to type of encounter  Labs and Imaging Results for orders placed or performed in visit on 09/03/18 (from the past 336 hour(s))    Beta hCG quant (ref lab)   Collection Time: 09/03/18  3:37 PM  Result Value Ref Range   hCG Quant 2 mIU/mL  Results for orders placed or performed in visit on 08/26/18 (from the past 336 hour(s))  Beta hCG quant (ref lab)   Collection Time: 08/26/18  3:07 PM  Result Value Ref Range   hCG Quant 17 mIU/mL   [place] Ob Transvaginal  Result Date: 09/03/2018 CLINICAL DATA:  Vaginal bleeding. First trimester pregnancy with inconclusive fetal viability. EXAM: TRANSVAGINAL OB ULTRASOUND TECHNIQUE: Transvaginal ultrasound was performed for complete evaluation of the gestation as well as the maternal uterus, adnexal regions, and pelvic cul-de-sac. COMPARISON:  08/17/2018 FINDINGS: Intrauterine gestational sac: None; previously seen sac-like fluid collection cervix is no longer visualized Maternal uterus/adnexae: Endometrium measures 8 mm in thickness. No mass identified. No evidence of fibroids. Both ovaries are normal appearance. No mass or identified. IMPRESSION: Previously seen sac-like fluid collection in cervix is no longer visualized, consistent with complete abortion. No mass or other significant abnormality identified. Electronically Signed   By: Danae OrleansJohn A Stahl M.D.   On: 09/03/2018 14:56   [place] Ob Transvaginal  Result Date: 08/17/2018 CLINICAL DATA:  Heavy vaginal bleeding EXAM: TRANSVAGINAL OB ULTRASOUND TECHNIQUE: Transvaginal ultrasound was performed for complete evaluation of the gestation as well as the maternal uterus, adnexal regions, and pelvic cul-de-sac. COMPARISON:  08/16/2018 FINDINGS: Intrauterine gestational sac: Elongated cystic area seen within the cervix. This may reflect abortion in progress. Yolk sac:  None Embryo:  None Cardiac Activity: Heart Rate:  bpm MSD: 6.3 mm   5 w   2 d CRL: Mm  w  d                  [place] EDC: Subchorionic hemorrhage:  None visualized. Maternal uterus/adnexae: Endometrium appears thickened, measuring up to 2.4 cm. No adnexal mass or free fluid. IMPRESSION: Elongated  cystic area within the cervix which could reflect the previously seen endometrial yolk sac or conceivably could reflect nabothian cyst. No yolk sac visualized currently within the endometrium as seen on yesterday's study. Findings concerning for abortion in progress. Electronically Signed   By: Rolm Baptise M.D.   On: 08/17/2018 21:20   US Ob Less Than 14 Weeks With Ob Transvaginal  Result Date: 08/16/2018 CLINICAL DATA:  Vaginal bleeding in 1st trimester pregnancy. EXAM: OBSTETRIC <14 WK [place] AND TRANSVAGINAL OB US TECHNIQUE: Both transabdominal and transvaginal ultrasound examinations were performed for complete evaluation of the gestation as well as the maternal uterus, adnexal regions, and pelvic cul-de-sac. Transvaginal technique was performed to assess early pregnancy. COMPARISON:  None. FINDINGS: Intrauterine gestational sac: Single Yolk sac:  Not Visualized. Embryo:  Visualized. Cardiac Activity: Not Visualized. CRL:  4 mm   6 w   1 d                  [place] EDC: 04/10/2019 Subchorionic hemorrhage:  Small to moderate subchorionic hemorrhage. Maternal uterus/adnexae: Retroflexed uterus. Normal appearance of both ovaries. No mass or abnormal free fluid identified. IMPRESSION: Findings are suspicious but not yet definitive for failed pregnancy. Recommend follow-up US in 7 days for definitive diagnosis. This recommendation follows SRU consensus guidelines: Diagnostic Criteria for Nonviable Pregnancy Early in the First Trimester. [place] Med 2013; 761:6073-71. Small to moderate subchorionic hemorrhage. Electronically Signed   By: Earle Gell M.D.   On: 08/16/2018 12:47       Assessment and Plan:     1. SAB (spontaneous abortion) 2. Complete miscarriage 3. History of recurrent miscarriages Completed miscarriage.She is worried about recurrent miscarriages, has been referred to [facility] for further evaluation and management. Patient advised that she is of advanced maternal age also, this is an independent risk  factor. She will follow up with specialist. All her concerns and questions were addressed.      I discussed the assessment and treatment plan with the patient. The patient was provided an opportunity to ask questions and all were answered. The patient agreed with the plan and demonstrated an understanding of the instructions.   The patient was advised to call back or seek an in-person evaluation/go to the ED if the symptoms worsen or if the condition fails to improve as anticipated.  I provided 15 minutes of face-to-face time during this encounter.   Verita Schneiders, MD Center for [employer] [facility], [place]

## 2018-09-04 NOTE — Patient Instructions (Signed)
Regrese a la clinica cuando tenga su cita. Si tiene problemas o preguntas, llama a la clinica o vaya a la [facility]a [facility] emerg[facility] al [facility].    

## 2019-10-11 IMAGING — US OBSTETRIC <14 WK US AND TRANSVAGINAL OB US
1 series · 15 of 28 positions shown · non-contrast
Comparison: None.

CLINICAL DATA: Vaginal bleeding in 1st trimester pregnancy.

EXAM:
OBSTETRIC <14 WK US AND TRANSVAGINAL OB US
TECHNIQUE: Both transabdominal and transvaginal ultrasound examinations were
performed for complete evaluation of the gestation as well as the
maternal uterus, adnexal regions, and pelvic cul-de-sac.
Transvaginal technique was performed to assess early pregnancy.

[Series 1: obstetric <14 wk us and transvaginal ob us · 15 of 36 slices shown]
[im 1/36]
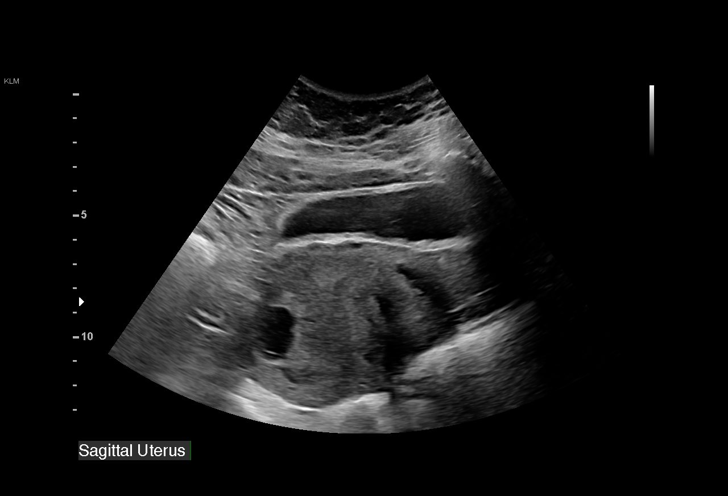
[im 3/36]
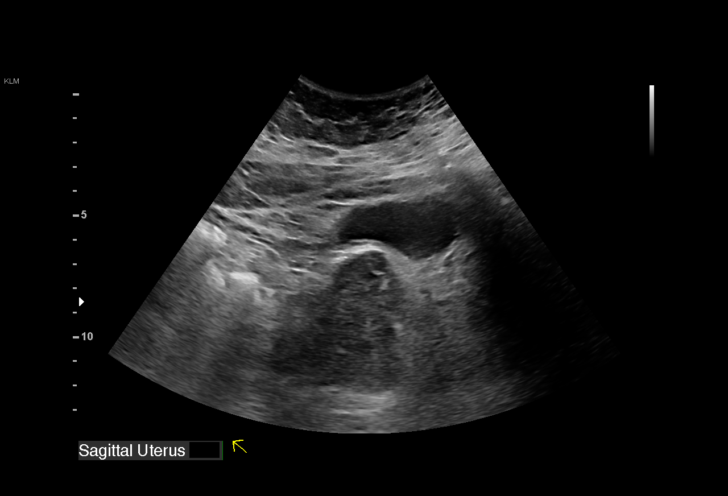
[im 6/36]
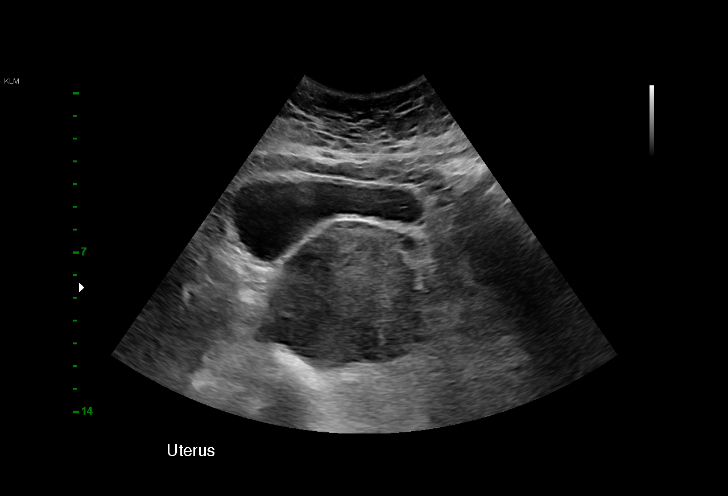
[im 8/36]
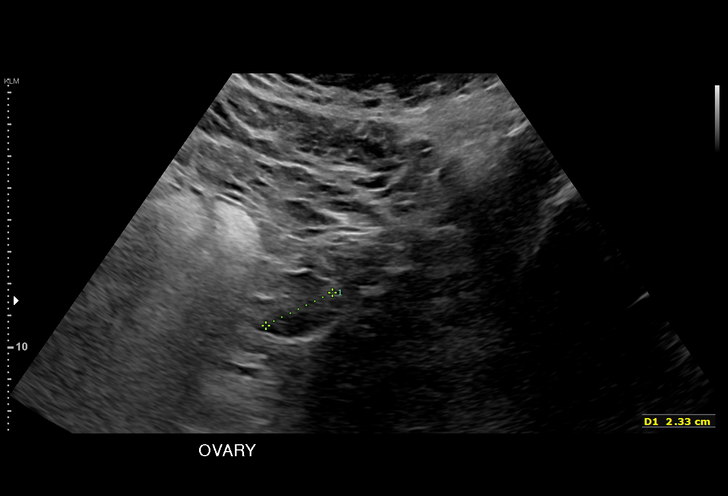
[im 11/36]
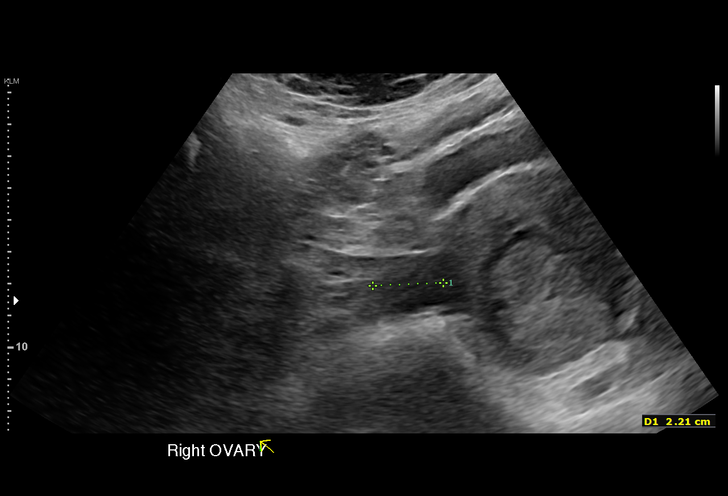
[im 13/36]
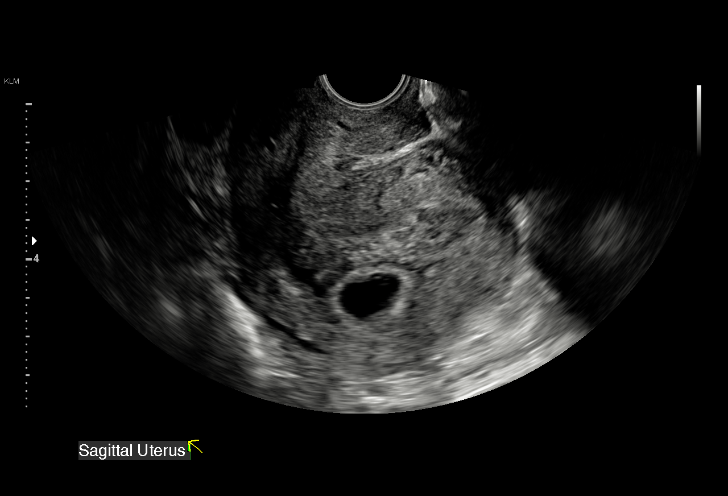
[im 16/36]
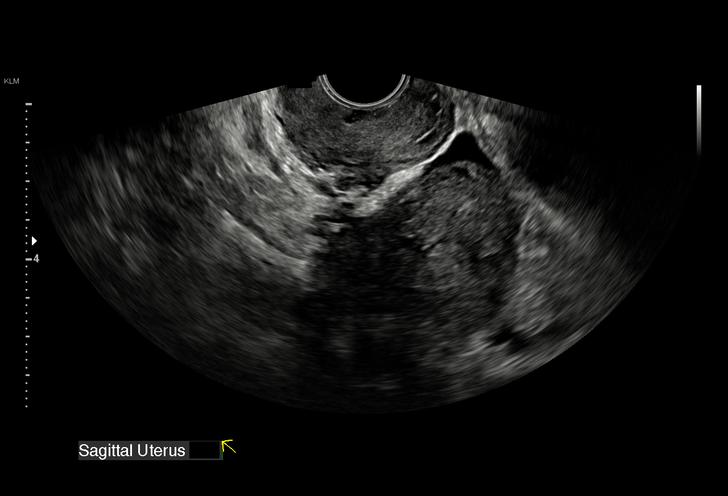
[im 19/36]
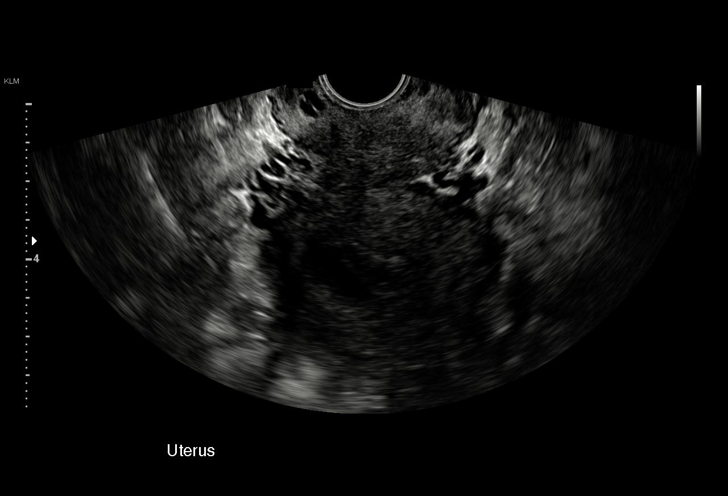
[im 20/36]
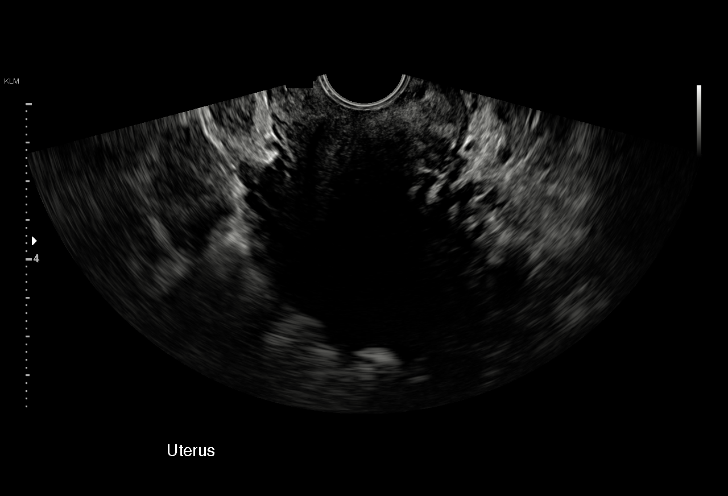
[im 23/36]
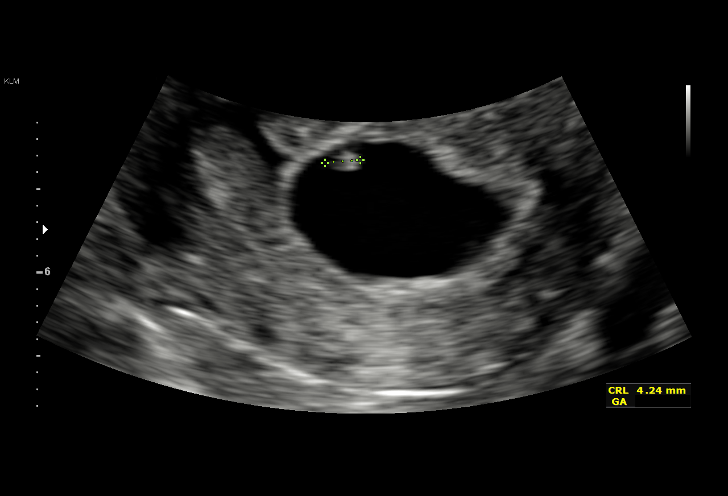
[im 25/36]
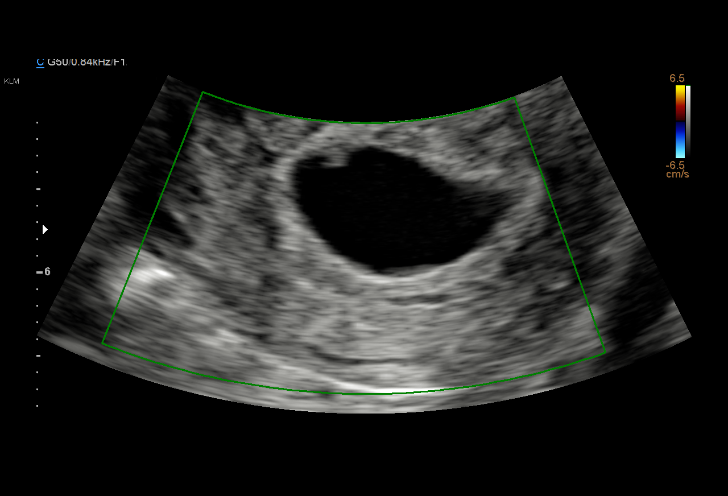
[im 28/36]
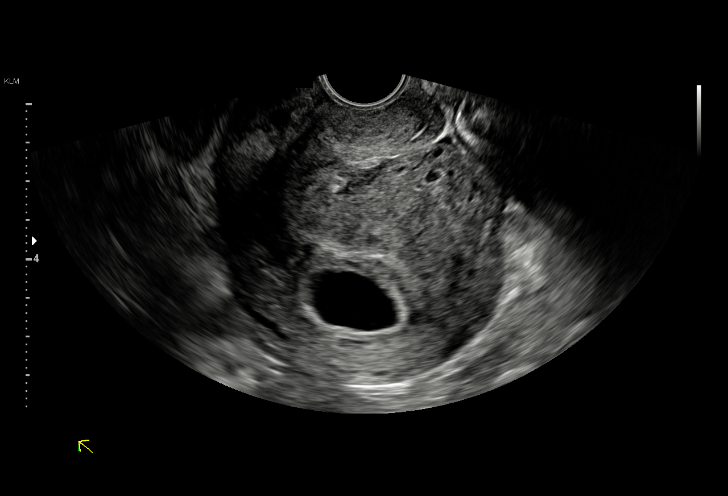
[im 30/36]
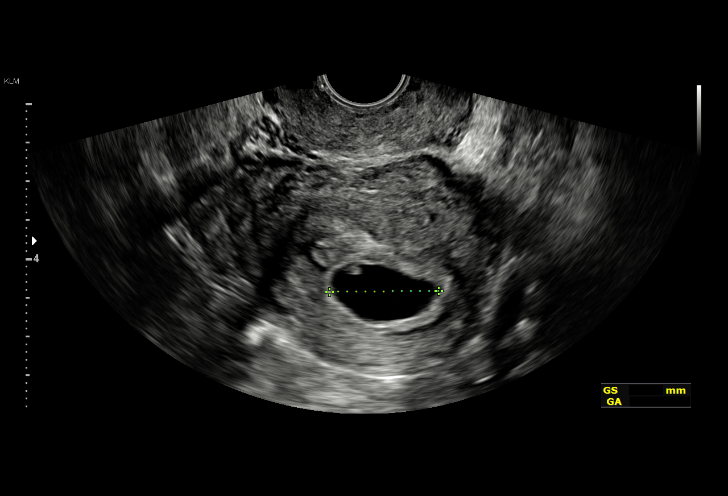
[im 33/36]
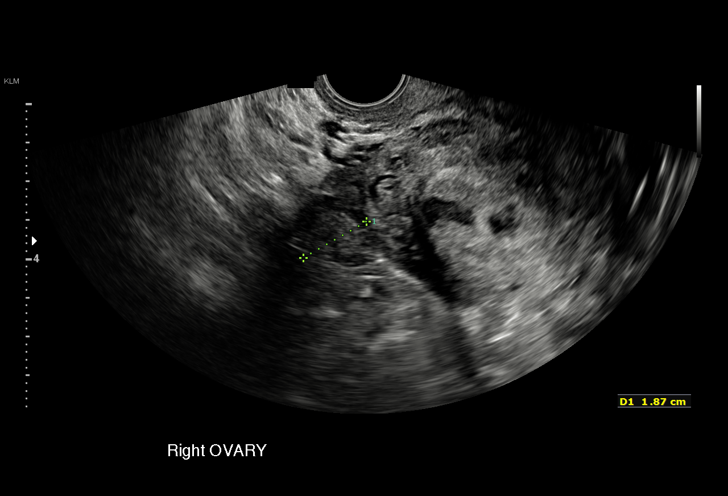
[im 36/36]
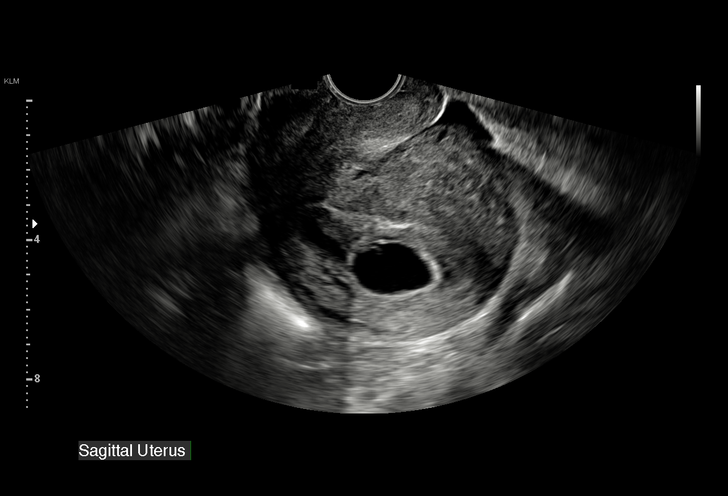

[15 of 28 positions shown; findings below may reference images not displayed]

FINDINGS: Intrauterine gestational sac: Single

Yolk sac:  Not Visualized.

Embryo:  Visualized.

Cardiac Activity: Not Visualized.

CRL:  4 mm   6 w   1 d                  US EDC: 04/10/2019

Subchorionic hemorrhage:  Small to moderate subchorionic hemorrhage.

Maternal uterus/adnexae: Retroflexed uterus. Normal appearance of
both ovaries. No mass or abnormal free fluid identified.
IMPRESSION: Findings are suspicious but not yet definitive for failed pregnancy.
Recommend follow-up US in 7 days for definitive diagnosis. This
recommendation follows SRU consensus guidelines: Diagnostic Criteria
for Nonviable Pregnancy Early in the First Trimester. N Engl J Med

Small to moderate subchorionic hemorrhage.

## 2019-10-12 IMAGING — US TRANSVAGINAL OB ULTRASOUND
1 series · 15 of 24 positions shown · non-contrast
Comparison: 08/16/2018

CLINICAL DATA: Heavy vaginal bleeding

EXAM:
TRANSVAGINAL OB ULTRASOUND
TECHNIQUE: Transvaginal ultrasound was performed for complete evaluation of the
gestation as well as the maternal uterus, adnexal regions, and
pelvic cul-de-sac.

[Series 1: transvaginal ob ultrasound · 24 acquisitions, 15 frames shown]
[im 1/24]
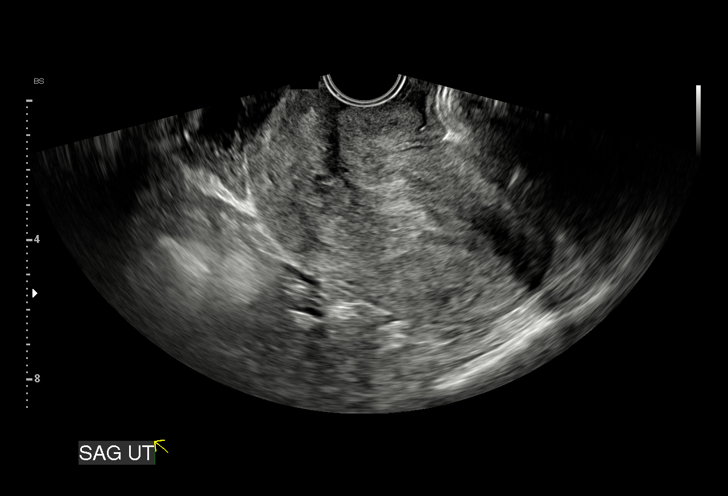
[im 3/24]
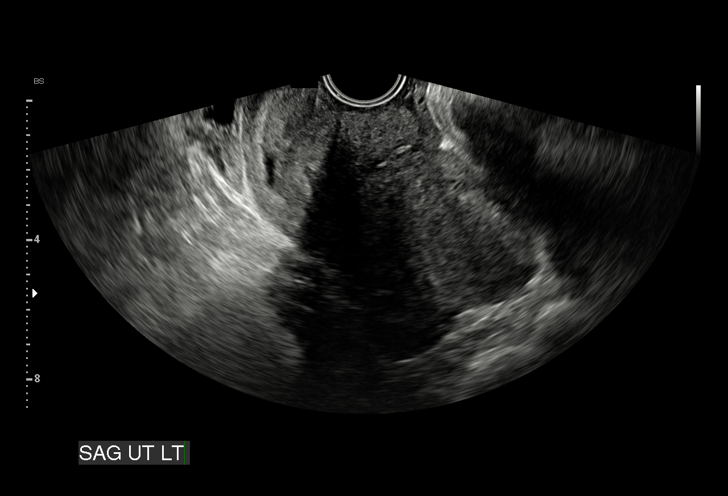
[im 5/24]
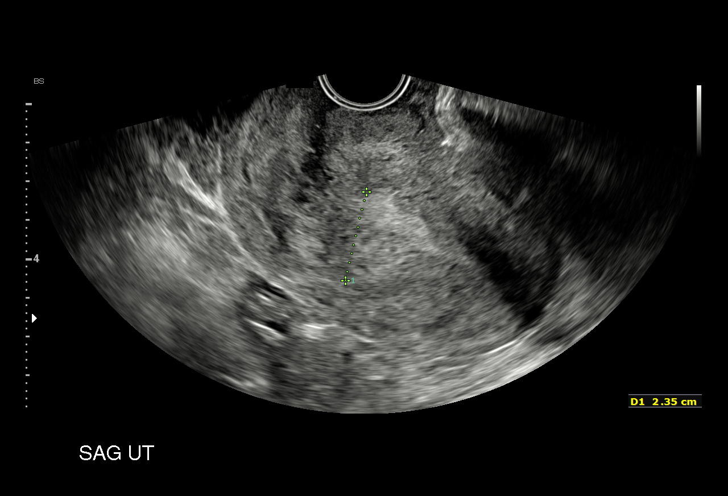
[im 6/24]
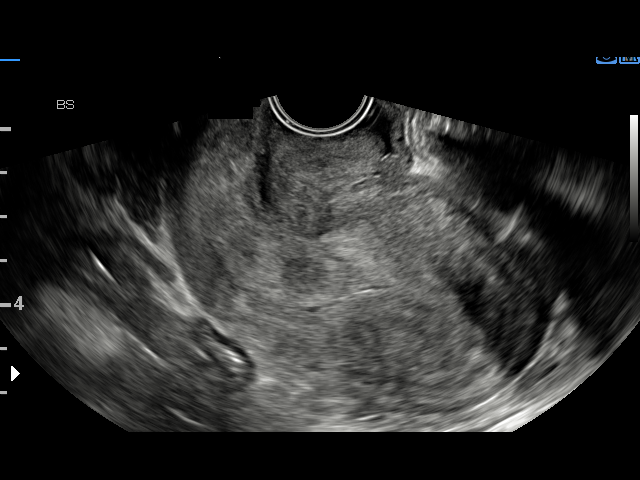
[im 8/24]
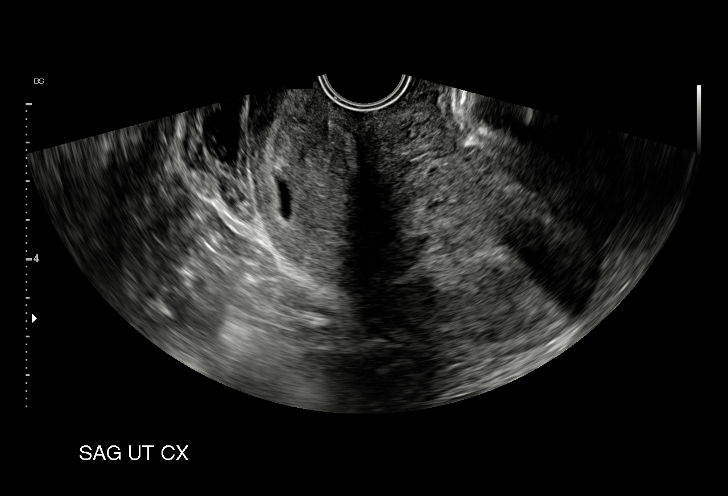
[im 9/24]
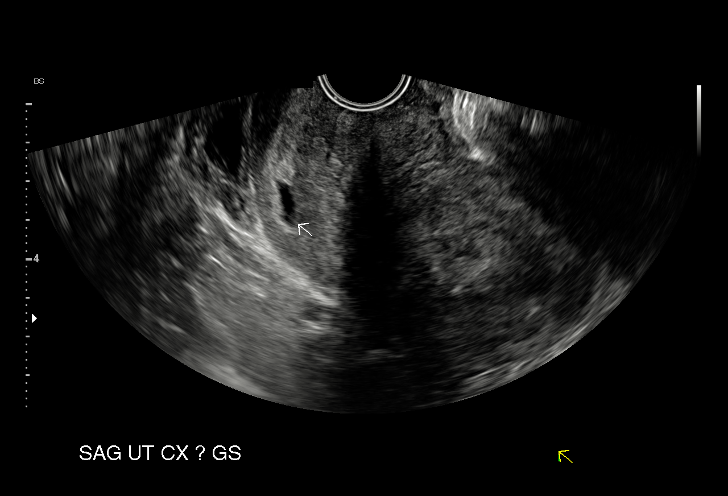
[im 11/24]
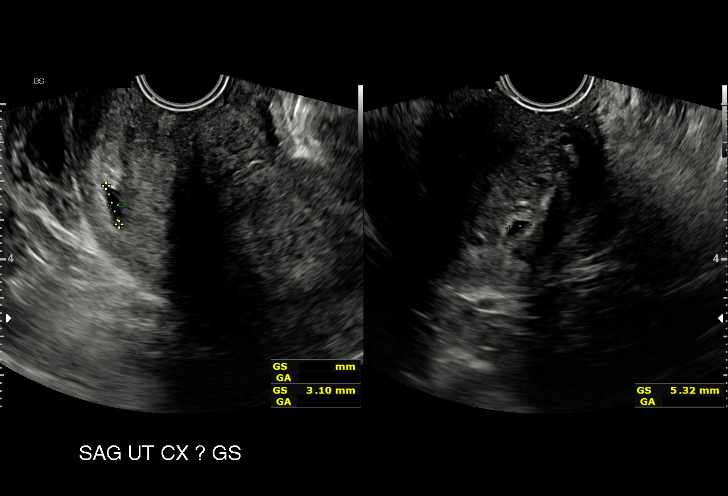
[im 13/24]
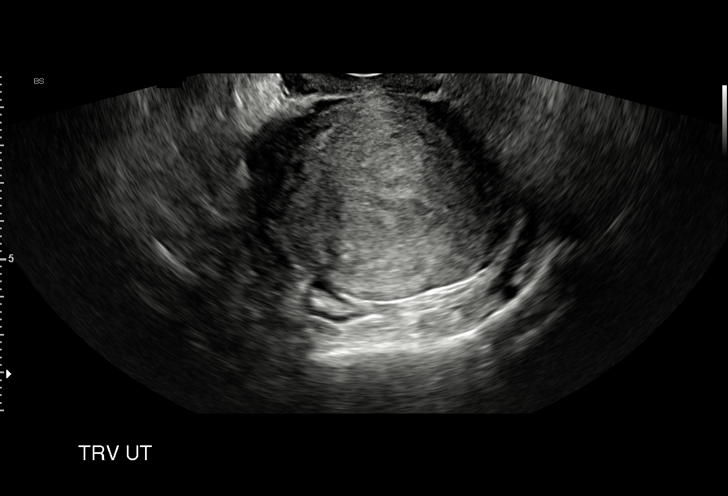
[im 14/24]
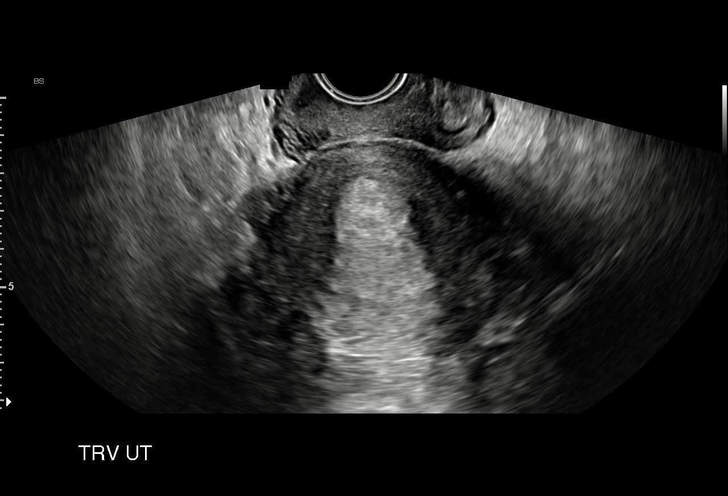
[im 16/24]
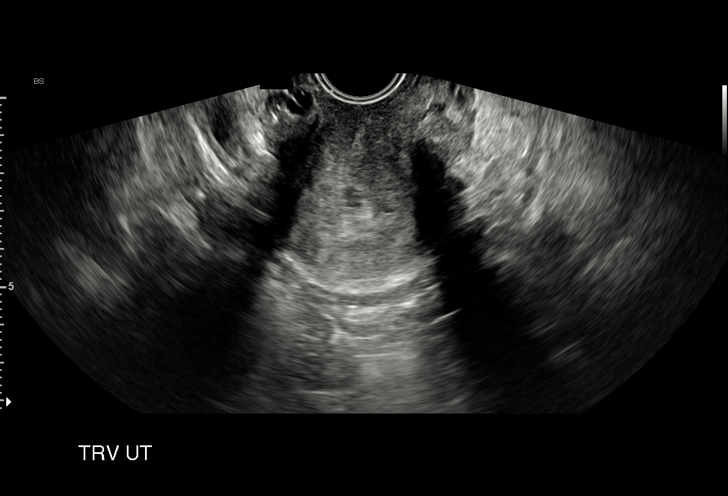
[im 17/24]
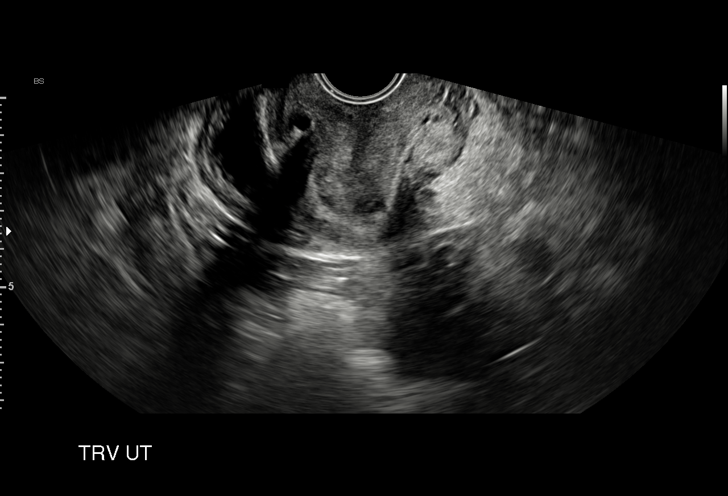
[im 19/24]
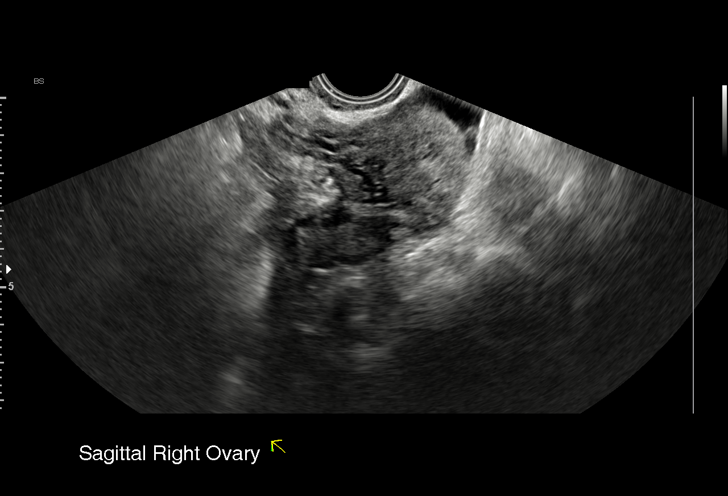
[im 21/24]
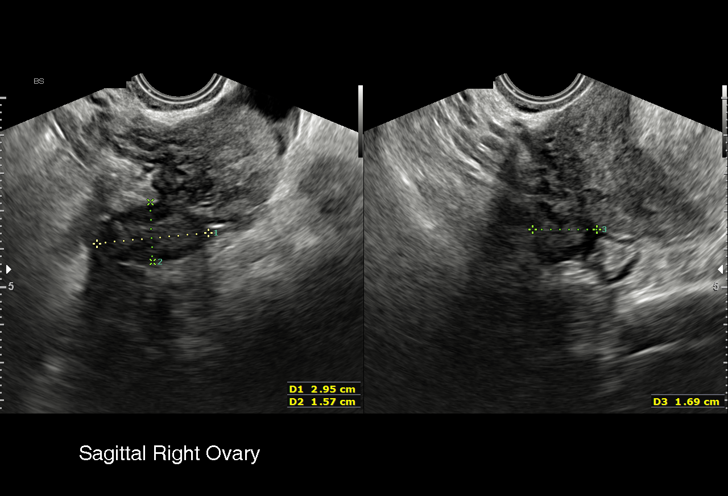
[im 22/24]
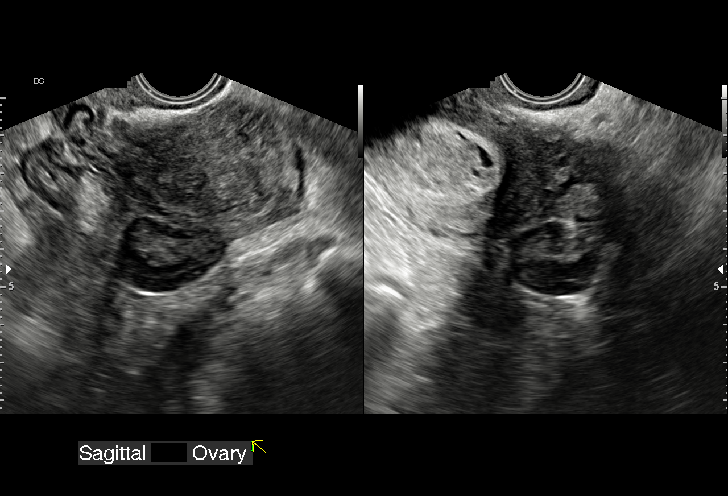
[im 24/24]
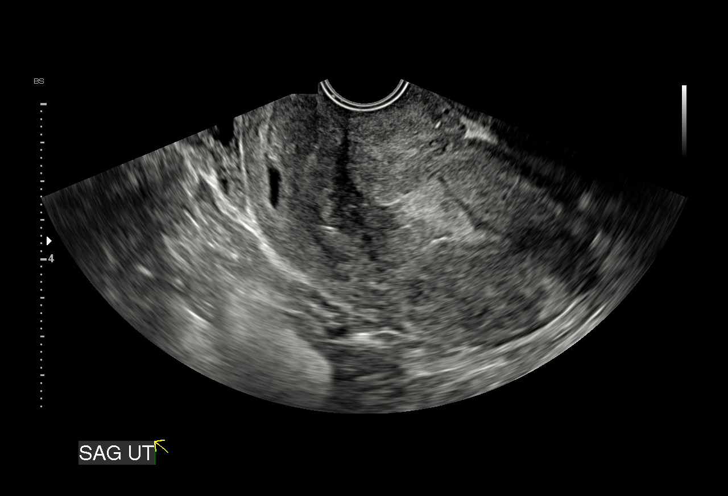

[15 of 24 positions shown; findings below may reference images not displayed]

FINDINGS: Intrauterine gestational sac: Elongated cystic area seen within the
cervix. This may reflect abortion in progress.

Yolk sac:  None

Embryo:  None

Cardiac Activity:

Heart Rate:  bpm

MSD: 6.3 mm   5 w   2 d

CRL: Mm  w  d                  US EDC:

Subchorionic hemorrhage:  None visualized.

Maternal uterus/adnexae: Endometrium appears thickened, measuring up
to 2.4 cm. No adnexal mass or free fluid.
IMPRESSION: Elongated cystic area within the cervix which could reflect the
previously seen endometrial yolk sac or conceivably could reflect
nabothian cyst. No yolk sac visualized currently within the
endometrium as seen on yesterday's study. Findings concerning for
abortion in progress.

## 2019-10-29 IMAGING — US TRANSVAGINAL OB ULTRASOUND
1 series · 15 of 24 positions shown · non-contrast
Comparison: 08/17/2018

CLINICAL DATA: Vaginal bleeding. First trimester pregnancy with
inconclusive fetal viability.

EXAM:
TRANSVAGINAL OB ULTRASOUND
TECHNIQUE: Transvaginal ultrasound was performed for complete evaluation of the
gestation as well as the maternal uterus, adnexal regions, and
pelvic cul-de-sac.

[Series 1: transvaginal ob ultrasound · 15 of 24 slices shown]
[im 1/24]
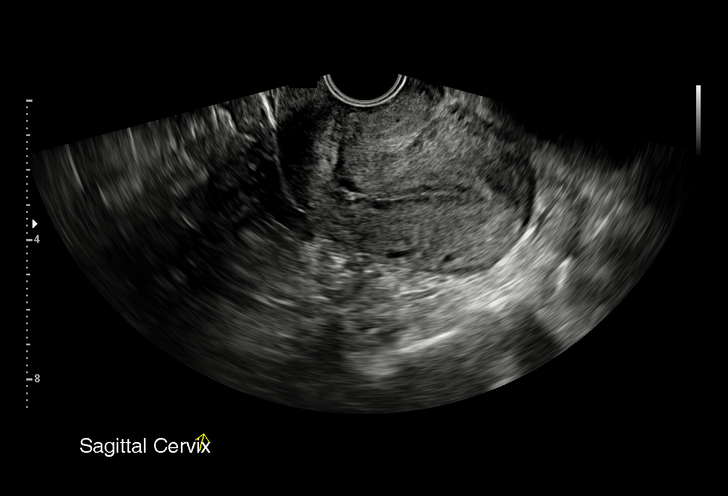
[im 3/24]
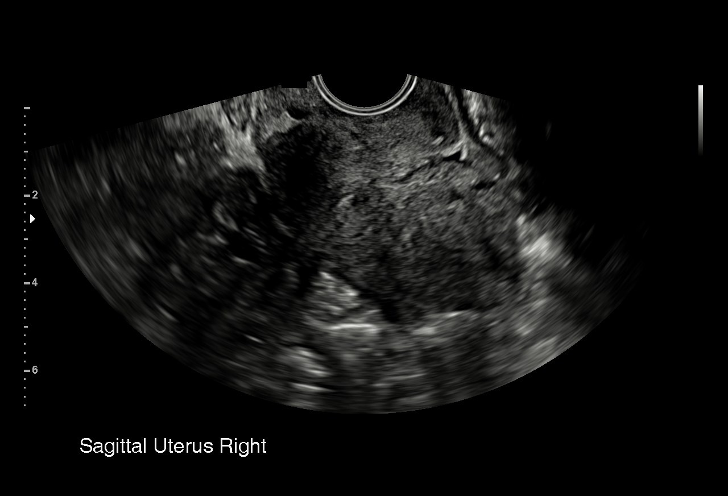
[im 5/24]
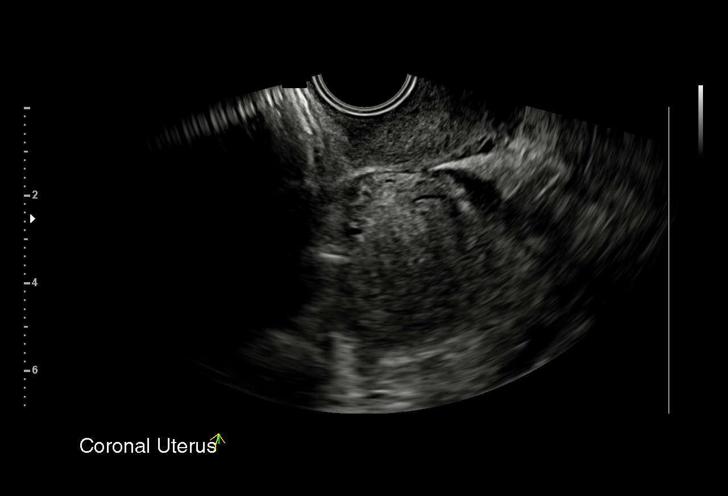
[im 6/24]
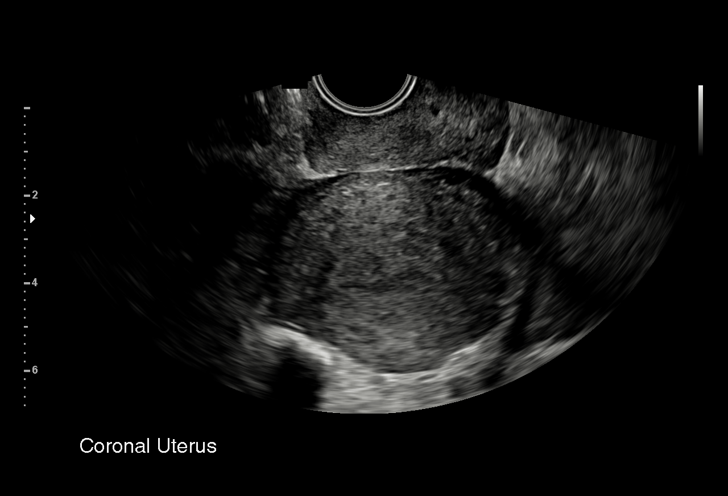
[im 8/24]
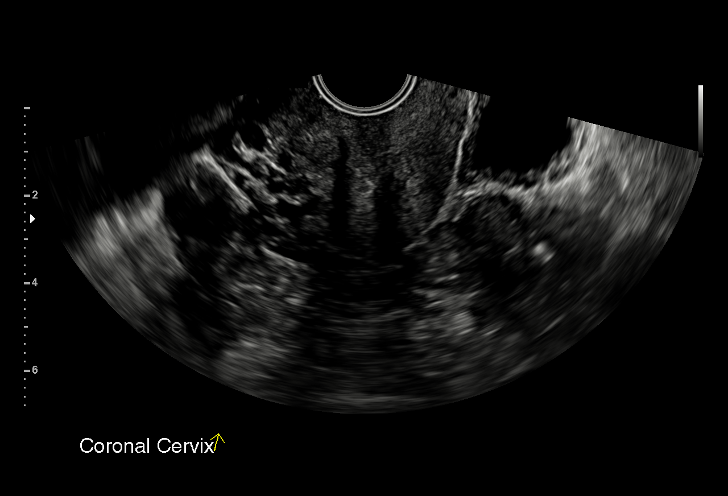
[im 9/24]
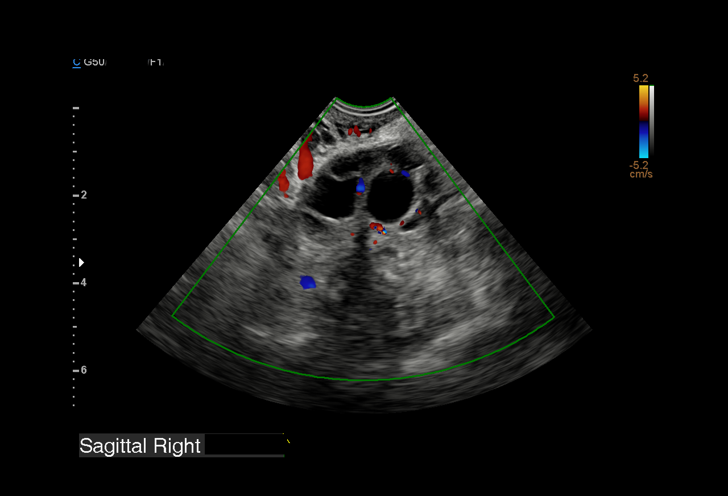
[im 11/24]
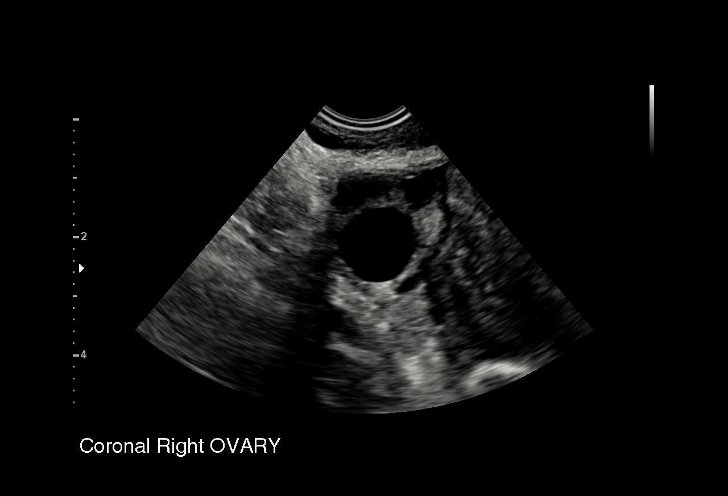
[im 13/24]
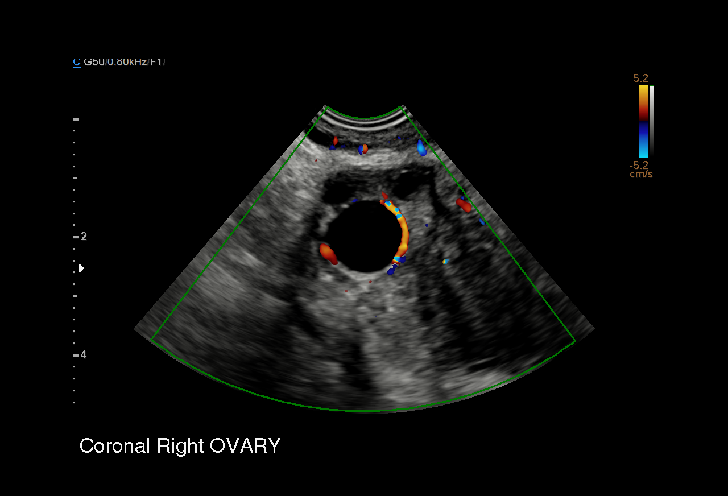
[im 14/24]
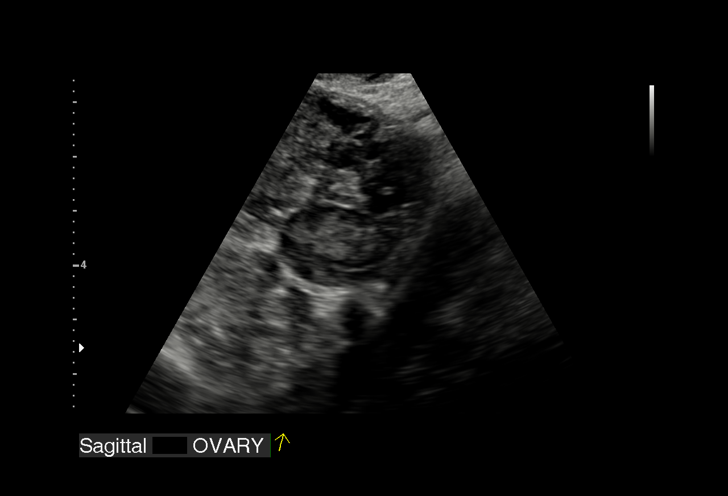
[im 16/24]
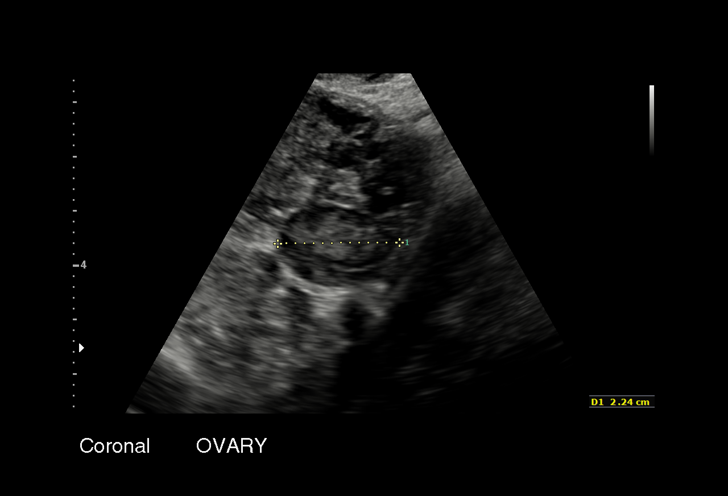
[im 17/24]
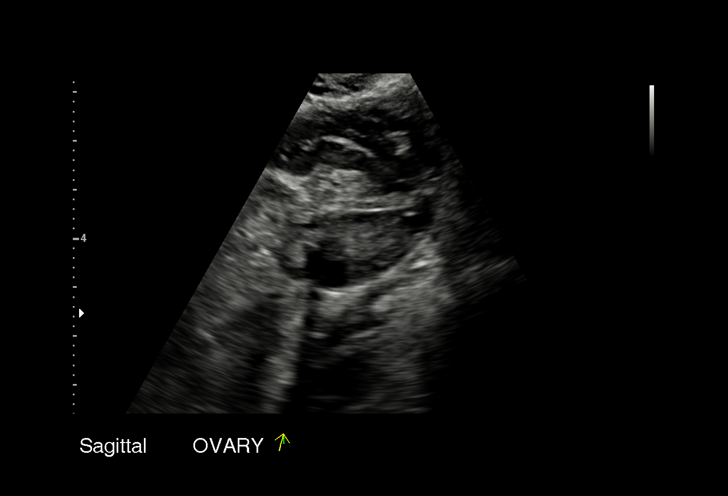
[im 19/24]
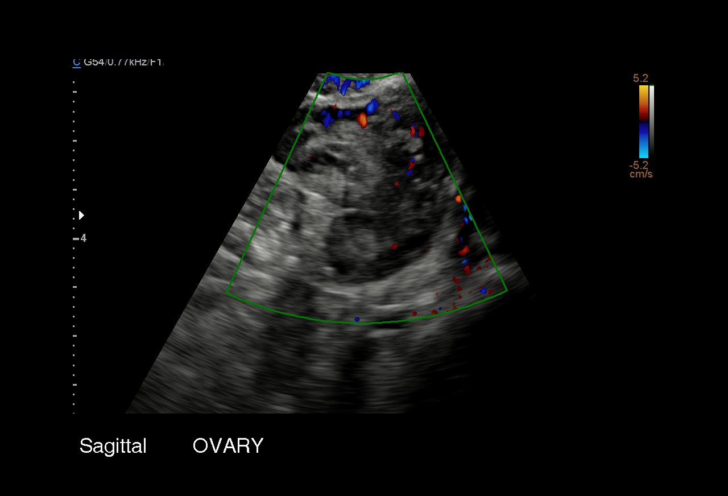
[im 21/24]
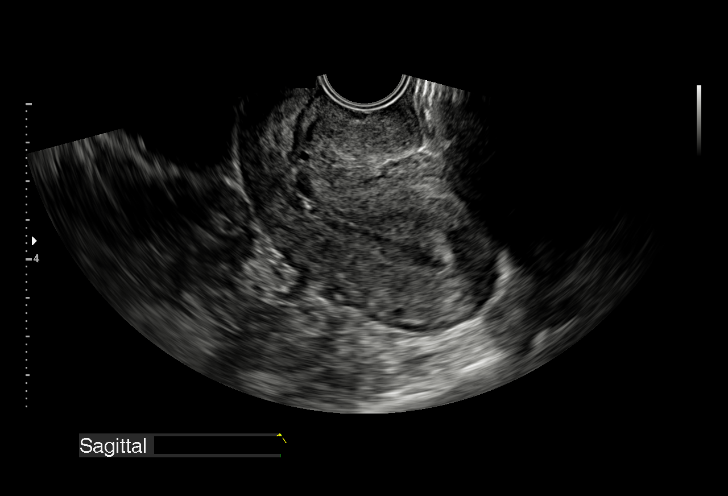
[im 22/24]
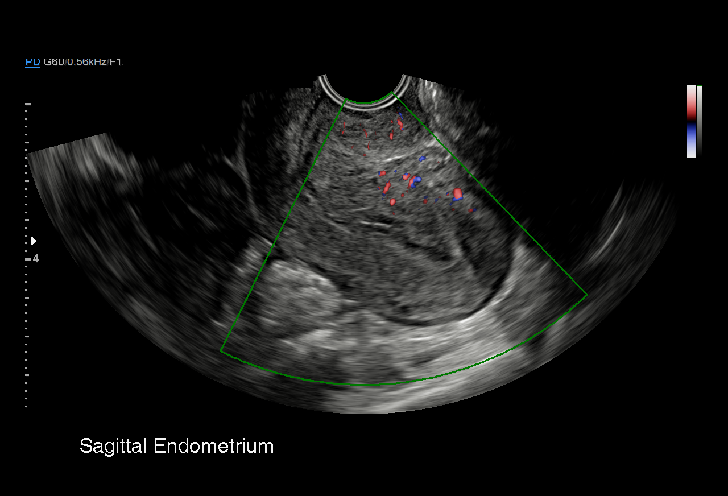
[im 24/24]
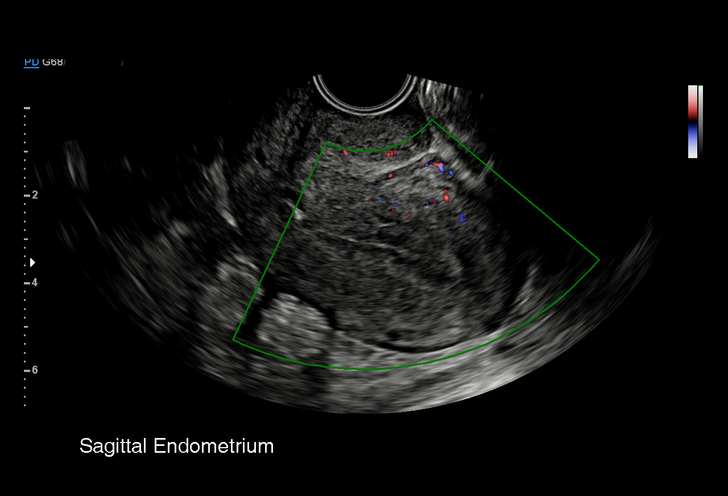

[15 of 24 positions shown; findings below may reference images not displayed]

FINDINGS: Intrauterine gestational sac: None; previously seen sac-like fluid
collection cervix is no longer visualized

Maternal uterus/adnexae: Endometrium measures 8 mm in thickness. No
mass identified. No evidence of fibroids. Both ovaries are normal
appearance. No mass or identified.
IMPRESSION: Previously seen sac-like fluid collection in cervix is no longer
visualized, consistent with complete abortion. No mass or other
significant abnormality identified.

## 2021-09-20 ENCOUNTER — Inpatient Hospital Stay (HOSPITAL_COMMUNITY): Payer: Self-pay

## 2021-09-20 ENCOUNTER — Inpatient Hospital Stay (HOSPITAL_COMMUNITY)
Admission: AD | Admit: 2021-09-20 | Discharge: 2021-09-20 | Disposition: A | Discharge status: Home / Self Care | Payer: Self-pay | Attending: Obstetrics and Gynecology | Admitting: Obstetrics and Gynecology

## 2021-09-20 ENCOUNTER — Encounter (HOSPITAL_COMMUNITY): Payer: Self-pay | Admitting: *Deleted

## 2021-09-20 DIAGNOSIS — O209 Hemorrhage in early pregnancy, unspecified: Secondary | ICD-10-CM | POA: Insufficient documentation

## 2021-09-20 DIAGNOSIS — Z3491 Encounter for supervision of normal pregnancy, unspecified, first trimester: Secondary | ICD-10-CM

## 2021-09-20 DIAGNOSIS — O09511 Supervision of elderly primigravida, first trimester: Secondary | ICD-10-CM | POA: Insufficient documentation

## 2021-09-20 DIAGNOSIS — Z3A01 Less than 8 weeks gestation of pregnancy: Secondary | ICD-10-CM | POA: Insufficient documentation

## 2021-09-20 LAB — CBC
HCT: 40.6 % (ref 36.0–46.0)
Hemoglobin: 13.7 g/dL (ref 12.0–15.0)
MCH: 30.8 pg (ref 26.0–34.0)
MCHC: 33.7 g/dL (ref 30.0–36.0)
MCV: 91.2 fL (ref 80.0–100.0)
Platelets: 352 10*3/uL (ref 150–400)
RBC: 4.45 MIL/uL (ref 3.87–5.11)
RDW: 13.5 % (ref 11.5–15.5)
WBC: 11 10*3/uL — ABNORMAL HIGH (ref 4.0–10.5)
nRBC: 0 % (ref 0.0–0.2)

## 2021-09-20 LAB — URINALYSIS, ROUTINE W REFLEX MICROSCOPIC
Bilirubin Urine: NEGATIVE
Glucose, UA: NEGATIVE mg/dL
Ketones, ur: NEGATIVE mg/dL
Nitrite: NEGATIVE
Protein, ur: NEGATIVE mg/dL
Specific Gravity, Urine: 1.006 (ref 1.005–1.030)
pH: 7 (ref 5.0–8.0)

## 2021-09-20 LAB — HCG, QUANTITATIVE, PREGNANCY: hCG, Beta Chain, Quant, S: 4612 m[IU]/mL — ABNORMAL HIGH (ref <5)

## 2021-09-20 LAB — WET PREP, GENITAL
Clue Cells Wet Prep HPF POC: NONE SEEN
Sperm: NONE SEEN
Trich, Wet Prep: NONE SEEN
WBC, Wet Prep HPF POC: >=10 — AB (ref <10)
Yeast Wet Prep HPF POC: NONE SEEN

## 2021-09-20 LAB — POCT PREGNANCY, URINE: Preg Test, Ur: POSITIVE — AB

## 2021-09-20 NOTE — MAU Note (Addendum)
.  Madison Contreras is a 46 y.o. at Unknown here in MAU reporting brown d/c this am that is now red when I wipe. Had brown spotting few days ago. While waiting in lobby had burning in lower abd but no pain now. Positive upt at home and at [employer] LMP: 5/17 Onset of complaint: few days ago.  Pain score: 0 Vitals:   09/20/21 2000 09/20/21 2001  BP:  (!) 138/94  Pulse: 94   Resp: 17   Temp: 98.1 F (36.7 C)   SpO2: 99%      FHT:n/a Lab orders placed from triage:  upt and u/a.

## 2021-09-20 NOTE — MAU Provider Note (Signed)
History     CSN: [id number]  Arrival date and time: 09/20/21 1948   None     Chief Complaint  Patient presents with   Vaginal Bleeding   HPI  Madison Contreras is a 46 y.o. [id number] at [redacted]w[redacted]d who presents for evaluation of vaginal bleeding. Patient reports she is seeing spotting when she wipes. It started as brown and has become more pink. She is very worried that she is having a miscarriage because this is what happened when she had her previous miscarriages. She denies any pain. She denies any discharge and leaking of fluid. Denies any constipation, diarrhea or any urinary complaints. She is unsure of her LMP   OB History     Gravida  6   Para  2   Term  2   Preterm  0   AB  2   Living  2      SAB  2   IAB  0   Ectopic  0   Multiple  0   Live Births  2           Past Medical History:  Diagnosis Date   Medical history non-contributory     Past Surgical History:  Procedure Laterality Date   DILATION AND EVACUATION      Family History  Problem Relation Age of Onset   Hyperlipidemia Mother    Hypertension Mother    Hypertension Father     Social History   Tobacco Use   Smoking status: Never   Smokeless tobacco: Never  Vaping Use   Vaping Use: Never used  Substance Use Topics   Alcohol use: Never   Drug use: Never    Allergies: No Known Allergies  Medications Prior to Admission  Medication Sig Dispense Refill Last Dose   acetaminophen (TYLENOL) 500 MG tablet Take 1,000 mg by mouth every 6 (six) hours as needed for mild pain or headache.      ibuprofen (ADVIL) 600 MG tablet Take 1 tablet (600 mg total) by mouth every 6 (six) hours as needed. 30 tablet 0    ibuprofen (ADVIL) 600 MG tablet Take 1 tablet (600 mg total) by mouth every 6 (six) hours as needed. 30 tablet 0    misoprostol (CYTOTEC) 200 MCG tablet Take 4 tablets (800 mcg total) by mouth once for 1 dose. Place in gumline and let dissolve 4 tablet 0    misoprostol (CYTOTEC) 200 MCG  tablet Take 4 tablets (800 mcg total) by mouth once for 1 dose. 4 tablet 0    oxyCODONE-acetaminophen (PERCOCET) 5-325 MG tablet Take 1 tablet by mouth every 4 (four) hours as needed for up to 12 doses for severe pain. 10 tablet 0    oxyCODONE-acetaminophen (PERCOCET) 5-325 MG tablet Take 1 tablet by mouth every 4 (four) hours as needed for up to 12 doses for severe pain. 12 tablet 0    Prenatal Multivit-Min-Fe-FA (PRENATAL VITAMINS) 0.8 MG tablet Take 1 tablet by mouth daily. 30 tablet 12     Review of Systems  Constitutional: Negative.  Negative for fatigue and fever.  HENT: Negative.    Respiratory: Negative.  Negative for shortness of breath.   Cardiovascular: Negative.  Negative for chest pain.  Gastrointestinal: Negative.  Negative for abdominal pain, constipation, diarrhea, nausea and vomiting.  Genitourinary:  Positive for vaginal bleeding. Negative for dysuria and vaginal discharge.  Neurological: Negative.  Negative for dizziness and headaches.   Physical Exam   Blood pressure (!) 138/94, pulse 94,  temperature 98.1 F (36.7 C), resp. rate 17, height 5\' 2"  (1.575 m), weight 99.3 kg, last menstrual period 07/20/2021, SpO2 99 %, unknown if currently breastfeeding.  Patient Vitals for the past 24 hrs:  BP Temp Pulse Resp SpO2 Height Weight  09/20/21 2001 (!) 138/94 -- -- -- -- -- --  09/20/21 2000 -- 98.1 F (36.7 C) 94 17 99 % 5\' 2"  (1.575 m) 99.3 kg    Physical Exam Vitals and nursing note reviewed.  Constitutional:      General: She is not in acute distress.    Appearance: She is well-developed.  HENT:     Head: Normocephalic.  Eyes:     Pupils: Pupils are equal, round, and reactive to light.  Cardiovascular:     Rate and Rhythm: Normal rate and regular rhythm.     Heart sounds: Normal heart sounds.  Pulmonary:     Effort: Pulmonary effort is normal. No respiratory distress.     Breath sounds: Normal breath sounds.  Abdominal:     General: Bowel sounds are normal.  There is no distension.     Palpations: Abdomen is soft.     Tenderness: There is no abdominal tenderness.  Skin:    General: Skin is warm and dry.  Neurological:     Mental Status: She is alert and oriented to person, place, and time.  Psychiatric:        Mood and Affect: Mood normal.        Behavior: Behavior normal.        Thought Content: Thought content normal.        Judgment: Judgment normal.     MAU Course  Procedures  Results for orders placed or performed during the hospital encounter of 09/20/21 (from the past 24 hour(s))  Urinalysis, Routine w reflex microscopic Urine, Clean Catch     Status: Abnormal   Collection Time: 09/20/21  8:10 PM  Result Value Ref Range   Color, Urine YELLOW YELLOW   APPearance HAZY (A) CLEAR   Specific Gravity, Urine 1.006 1.005 - 1.030   pH 7.0 5.0 - 8.0   Glucose, UA NEGATIVE NEGATIVE mg/dL   Hgb urine dipstick LARGE (A) NEGATIVE   Bilirubin Urine NEGATIVE NEGATIVE   Ketones, ur NEGATIVE NEGATIVE mg/dL   Protein, ur NEGATIVE NEGATIVE mg/dL   Nitrite NEGATIVE NEGATIVE   Leukocytes,Ua SMALL (A) NEGATIVE   RBC / HPF 11-20 0 - 5 RBC/hpf   WBC, UA 11-20 0 - 5 WBC/hpf   Bacteria, UA MANY (A) NONE SEEN   Squamous Epithelial / LPF 0-5 0 - 5   Mucus PRESENT   Wet prep, genital     Status: Abnormal   Collection Time: 09/20/21  8:20 PM   Specimen: Vaginal  Result Value Ref Range   Yeast Wet Prep HPF POC NONE SEEN NONE SEEN   Trich, Wet Prep NONE SEEN NONE SEEN   Clue Cells Wet Prep HPF POC NONE SEEN NONE SEEN   WBC, Wet Prep HPF POC >=10 (A) <10   Sperm NONE SEEN   Pregnancy, urine POC     Status: Abnormal   Collection Time: 09/20/21  8:23 PM  Result Value Ref Range   Preg Test, Ur POSITIVE (A) NEGATIVE  CBC     Status: Abnormal   Collection Time: 09/20/21  8:38 PM  Result Value Ref Range   WBC 11.0 (H) 4.0 - 10.5 K/uL   RBC 4.45 3.87 - 5.11 MIL/uL   Hemoglobin 13.7 12.0 -  15.0 g/dL   HCT 80.0 34.9 - 17.9 %   MCV 91.2 80.0 - 100.0  fL   MCH 30.8 26.0 - 34.0 pg   MCHC 33.7 30.0 - 36.0 g/dL   RDW 15.0 56.9 - 79.4 %   Platelets 352 150 - 400 K/uL   nRBC 0.0 0.0 - 0.2 %     US OB LESS THAN 14 WEEKS WITH OB TRANSVAGINAL  Result Date: 09/20/2021 CLINICAL DATA:  Vaginal discharge, pregnant EXAM: OBSTETRIC <14 WK [place] AND TRANSVAGINAL OB US TECHNIQUE: Both transabdominal and transvaginal ultrasound examinations were performed for complete evaluation of the gestation as well as the maternal uterus, adnexal regions, and pelvic cul-de-sac. Transvaginal technique was performed to assess early pregnancy. COMPARISON:  None Available. FINDINGS: Intrauterine gestational sac: Single Yolk sac:  Visualized. Embryo:  Not Visualized. Cardiac Activity: Not Visualized. MSD: 13.1 mm   6 w   1 d Subchorionic hemorrhage:  None visualized. Maternal uterus/adnexae: No adnexal masses or free fluid. Uterus is retroverted. IMPRESSION: 1. Probable early intrauterine gestational sac and yolk sac, but no fetal pole or cardiac activity yet visualized. Recommend follow-up quantitative B-HCG levels and follow-up US in 14 days to assess viability. This recommendation follows SRU consensus guidelines: Diagnostic Criteria for Nonviable Pregnancy Early in the First Trimester. Malva Limes Med 2013; 801:6553-74. Electronically Signed   By: Sharlet Salina M.D.   On: 09/20/2021 21:07     MDM Labs ordered and reviewed.   UA, UPT CBC, HCG ABO/Rh- O Pos Wet prep and gc/chlamydia US OB Comp Less 14 weeks with Transvaginal  CNM independently reviewed the imaging ordered. Imaging show intrauterine gestational sac with yolk sac  CNM reviewed results with patient and recommendation for repeat ultrasound in 2 weeks. Reassurance of normalcy on this exam but CNM expressed inability to predict what may happen in first trimester. Appointment made for ultrasound in 2 weeks at [facility]  Assessment and Plan   1. Normal intrauterine pregnancy on prenatal ultrasound in first trimester    2. [redacted] weeks gestation of pregnancy     -Discharge home in stable condition -First trimester precautions discussed -Patient advised to follow-up with [facility] on August 1 for repeat ultrasound -Patient may return to MAU as needed or if her condition were to change or worsen  Rolm Bookbinder, CNM 09/20/2021, 9:25 PM

## 2021-09-20 NOTE — Progress Notes (Signed)
Using Raquel, in-house Spanish interpreter, written and verbal d/c instructions given and understanding voiced.

## 2021-09-20 NOTE — Discharge Instructions (Signed)

## 2021-09-20 NOTE — MAU Note (Signed)
Spanish interpreter explained to pt how to collect vaginal swabs which she did without difficulty. Pt then returned to lobby

## 2021-09-21 LAB — GC/CHLAMYDIA PROBE AMP (~~LOC~~) NOT AT ARMC
Chlamydia: NEGATIVE
Comment: NEGATIVE
Comment: NORMAL
Neisseria Gonorrhea: NEGATIVE

## 2021-09-26 ENCOUNTER — Inpatient Hospital Stay (HOSPITAL_COMMUNITY)
Admission: AD | Admit: 2021-09-26 | Discharge: 2021-09-26 | Disposition: A | Discharge status: Home / Self Care | Payer: Self-pay | Attending: Obstetrics & Gynecology | Admitting: Obstetrics & Gynecology

## 2021-09-26 ENCOUNTER — Inpatient Hospital Stay (HOSPITAL_COMMUNITY): Payer: Self-pay

## 2021-09-26 ENCOUNTER — Encounter (HOSPITAL_COMMUNITY): Payer: Self-pay | Admitting: Obstetrics & Gynecology

## 2021-09-26 DIAGNOSIS — O4693 Antepartum hemorrhage, unspecified, third trimester: Secondary | ICD-10-CM | POA: Insufficient documentation

## 2021-09-26 DIAGNOSIS — R109 Unspecified abdominal pain: Secondary | ICD-10-CM

## 2021-09-26 DIAGNOSIS — O09523 Supervision of elderly multigravida, third trimester: Secondary | ICD-10-CM | POA: Insufficient documentation

## 2021-09-26 DIAGNOSIS — O26891 Other specified pregnancy related conditions, first trimester: Secondary | ICD-10-CM

## 2021-09-26 DIAGNOSIS — O209 Hemorrhage in early pregnancy, unspecified: Secondary | ICD-10-CM

## 2021-09-26 DIAGNOSIS — Z3A09 9 weeks gestation of pregnancy: Secondary | ICD-10-CM | POA: Insufficient documentation

## 2021-09-26 DIAGNOSIS — O034 Incomplete spontaneous abortion without complication: Secondary | ICD-10-CM | POA: Insufficient documentation

## 2021-09-26 DIAGNOSIS — O039 Complete or unspecified spontaneous abortion without complication: Secondary | ICD-10-CM

## 2021-09-26 LAB — CBC
HCT: 39.5 % (ref 36.0–46.0)
Hemoglobin: 13.6 g/dL (ref 12.0–15.0)
MCH: 30.8 pg (ref 26.0–34.0)
MCHC: 34.4 g/dL (ref 30.0–36.0)
MCV: 89.6 fL (ref 80.0–100.0)
Platelets: 373 10*3/uL (ref 150–400)
RBC: 4.41 MIL/uL (ref 3.87–5.11)
RDW: 13.3 % (ref 11.5–15.5)
WBC: 12.6 10*3/uL — ABNORMAL HIGH (ref 4.0–10.5)
nRBC: 0 % (ref 0.0–0.2)

## 2021-09-26 LAB — HCG, QUANTITATIVE, PREGNANCY: hCG, Beta Chain, Quant, S: 2496 m[IU]/mL — ABNORMAL HIGH (ref <5)

## 2021-09-26 MED ORDER — IBUPROFEN 600 MG PO TABS: 600.0000 mg | ORAL_TABLET | Freq: Four times a day (QID) | ORAL | 0 refills | Status: DC | PRN

## 2021-09-26 MED ORDER — PROMETHAZINE HCL 12.5 MG PO TABS: 12.5000 mg | ORAL_TABLET | Freq: Four times a day (QID) | ORAL | 0 refills | Status: DC | PRN

## 2021-09-26 MED ORDER — ACETAMINOPHEN-CODEINE 300-30 MG PO TABS: 1.0000 | ORAL_TABLET | Freq: Four times a day (QID) | ORAL | 0 refills | Status: DC | PRN

## 2021-09-26 MED ORDER — HYDROMORPHONE HCL 1 MG/ML IJ SOLN
1.0000 mg | Freq: Once | INTRAMUSCULAR | Status: AC
  Administered 2021-09-26: 1 mg via INTRAMUSCULAR
  Filled 2021-09-26: qty 1

## 2021-09-26 MED ORDER — PROCHLORPERAZINE EDISYLATE 10 MG/2ML IJ SOLN
10.0000 mg | Freq: Once | INTRAMUSCULAR | Status: AC
  Administered 2021-09-26: 10 mg via INTRAMUSCULAR
  Filled 2021-09-26: qty 2

## 2021-09-26 NOTE — MAU Note (Signed)
.  Madison Contreras is a 46 y.o. at [redacted]w[redacted]d here in MAU reporting: vaginal bleeding that started as spotting yesterday morning and has since gotten heavier. She is currently wearing a pad. She reports sharp pain in her "tailbone" (10/10) that started tonight.   Onset of complaint: yesterday Pain score: 10/10 Vitals:   09/26/21 0043  Pulse: 91  Resp: 18  Temp: 98.7 F (37.1 C)  SpO2: 100%      Lab orders placed from triage:  UA

## 2021-09-26 NOTE — MAU Provider Note (Signed)
History     CSN: [id number]  Arrival date and time: 09/26/21 0014   Event Date/Time   First Provider Initiated Contact with Patient 09/26/21 0105      Chief Complaint  Patient presents with   Vaginal Bleeding   Ms. Madison Contreras is a 46 y.o. year old [id number] female at [redacted]w[redacted]d weeks gestation who presents to MAU reporting vaginal spotting earlier today, but heavy VB at midnight when she got up to use the BR. She also reports having "groin" pain that feels like heaviness. She was seen in MAU on 09/20/2021 for VB and concerns that she was miscarrying. She was reassured that her pregnancy was normal in the uterus, but she would need a repeat U/S in 7-10 days to check the viability of the baby. Her HCG level was 4,612 on 09/20/2021. She denies any recent SI. Her spouse is present and contributing to the history taking.   **[employer] Video Spanish Interpreter, Meredeth Ide [phone] used for history taking, assessment and exam**    OB History     Gravida  5   Para  2   Term  2   Preterm  0   AB  2   Living  2      SAB  2   IAB  0   Ectopic  0   Multiple  0   Live Births  2           Past Medical History:  Diagnosis Date   Medical history non-contributory     Past Surgical History:  Procedure Laterality Date   DILATION AND EVACUATION      Family History  Problem Relation Age of Onset   Hyperlipidemia Mother    Hypertension Mother    Hypertension Father     Social History   Tobacco Use   Smoking status: Never   Smokeless tobacco: Never  Vaping Use   Vaping Use: Never used  Substance Use Topics   Alcohol use: Never   Drug use: Never    Allergies: No Known Allergies  No medications prior to admission.    Review of Systems  Constitutional: Negative.   HENT: Negative.    Eyes: Negative.   Respiratory: Negative.    Cardiovascular: Negative.   Gastrointestinal: Negative.   Endocrine: Negative.   Genitourinary:  Positive for pelvic pain and  vaginal bleeding (passed 2 large clots in MAU BR while attempting to collect CCUA).  Musculoskeletal: Negative.   Skin: Negative.   Allergic/Immunologic: Negative.   Neurological: Negative.   Hematological: Negative.   Psychiatric/Behavioral: Negative.     Physical Exam   Patient Vitals for the past 24 hrs:  BP Temp Temp src Pulse Resp SpO2 Height Weight  09/26/21 0336 111/60 -- -- 82 -- -- -- --  09/26/21 0201 132/63 98.2 F (36.8 C) Oral 84 16 99 % -- --  09/26/21 0052 (!) 146/78 -- -- -- -- -- -- --  09/26/21 0043 (!) 141/80 98.7 F (37.1 C) Oral 91 18 100 % 5\' 2"  (1.575 m) 101.2 kg     Physical Exam Vitals and nursing note reviewed. Exam conducted with a chaperone present.  Constitutional:      Appearance: Normal appearance. She is obese.  Cardiovascular:     Rate and Rhythm: Normal rate.  Pulmonary:     Effort: Pulmonary effort is normal.  Abdominal:     Palpations: Abdomen is soft.  Genitourinary:    General: Normal vulva.     Comments:  Pelvic exam: External genitalia normal, SE: vaginal walls pink and well rugated, cervix is smooth, pink, no lesions, large  amt of bright, red blood in vaginal vault, Bimanual exam deferred Musculoskeletal:        General: Normal range of motion.  Skin:    General: Skin is warm and dry.  Neurological:     Mental Status: She is alert and oriented to person, place, and time.  Psychiatric:        Mood and Affect: Mood normal.        Behavior: Behavior normal.        Thought Content: Thought content normal.        Judgment: Judgment normal.   Reassessment @ 0235: [employer] Video Spanish Interpreter, [place] #[id number] used to discuss results of U/S -- IUGS previously seen not seen on today's exam with blood and possible POC. Patient reporting pain that she "cannot take." Requesting something for pain. CNM will order pain medication. VB improved -- quarter size spot on pad now.  MAU Course  Procedures  MDM CBC HCG OB <14 wks  TVUS Dilaudid 1 mg IM -- resolved pain; rated 2/10 Compazine 10 mg IM -- no N/V  Results for orders placed or performed during the hospital encounter of 09/26/21 (from the past 24 hour(s))  CBC     Status: Abnormal   Collection Time: 09/26/21  1:59 AM  Result Value Ref Range   WBC 12.6 (H) 4.0 - 10.5 K/uL   RBC 4.41 3.87 - 5.11 MIL/uL   Hemoglobin 13.6 12.0 - 15.0 g/dL   HCT 39.5 36.0 - 46.0 %   MCV 89.6 80.0 - 100.0 fL   MCH 30.8 26.0 - 34.0 pg   MCHC 34.4 30.0 - 36.0 g/dL   RDW 13.3 11.5 - 15.5 %   Platelets 373 150 - 400 K/uL   nRBC 0.0 0.0 - 0.2 %  hCG, quantitative, pregnancy     Status: Abnormal   Collection Time: 09/26/21  1:59 AM  Result Value Ref Range   hCG, Beta Chain, Quant, S 2,496 (H) <5 mIU/mL     US OB Transvaginal  Result Date: 09/26/2021 CLINICAL DATA:  Pregnant, heavy vaginal bleeding EXAM: TRANSVAGINAL OB ULTRASOUND TECHNIQUE: Transvaginal ultrasound was performed for complete evaluation of the gestation as well as the maternal uterus, adnexal regions, and pelvic cul-de-sac. COMPARISON:  09/20/2021 FINDINGS: Intrauterine gestational sac: None Yolk sac:  Not Visualized. Embryo:  Not Visualized. Maternal uterus/adnexae: Endometrial complex measures 24 mm. Bilateral ovaries are within normal limits, noting a right corpus luteum. Trace free fluid. IMPRESSION: No IUP is visualized. When correlating with the prior study, this reflects missed abortion/abortion in progress. Thickened endometrium presumably reflects a combination of blood products and/or products of conception. Consider follow-up to document completion in 7 days, as clinically warranted. Electronically Signed   By: Julian Hy M.D.   On: 09/26/2021 02:07   Assessment and Plan  Miscarriage at 8 to [redacted] weeks gestation  - Advised of SAB dx'd by U/S - Information provided on miscarriage - Repeat HCG in 1 week - See provider in 2 weeks   Vaginal bleeding affecting early pregnancy  - Return to MAU: If  you have heavier bleeding that soaks through more that 2 pads per hour for an hour or more If you bleed so much that you feel like you might pass out or you do pass out If you have significant abdominal pain that is not improved with Tylenol 1000 mg  every 8 hours as needed for pain If you develop a fever > 100.5   Abdominal pain during pregnancy in first trimester  - Information provided on abdominal pain in pregnancy   [redacted] weeks gestation of pregnancy   - Discharge patient - Keep scheduled appt with [facility] on 10/03/2021 - Patient verbalized an understanding of the plan of care and agrees.    **[employer] Video Spanish Alycia Patten #[id number] used for discharge instructions**    Raelyn Mora, CNM 09/26/2021, 1:05 AM

## 2021-09-26 NOTE — Discharge Instructions (Signed)
Regreso a MAU:  Si tiene un sangrado ms abundante que empapa ms de 2 toallas sanitarias por hora durante una hora o ms  Si sangra tanto que siente que se va a Artist o se desmaya  Si tiene dolor abdominal significativo que no mejora con ibuprofeno o Tylenol #3 segn lo prescrito para el dolor  Si desarrolla fiebre > 100.5

## 2021-10-03 ENCOUNTER — Ambulatory Visit: Payer: Self-pay

## 2021-10-03 ENCOUNTER — Other Ambulatory Visit: Payer: Self-pay

## 2021-10-03 DIAGNOSIS — O039 Complete or unspecified spontaneous abortion without complication: Secondary | ICD-10-CM

## 2021-10-04 ENCOUNTER — Other Ambulatory Visit: Payer: Self-pay

## 2021-10-04 LAB — BETA HCG QUANT (REF LAB): hCG Quant: 21 m[IU]/mL

## 2021-10-12 ENCOUNTER — Ambulatory Visit: Payer: Self-pay | Admitting: Obstetrics and Gynecology

## 2022-04-28 ENCOUNTER — Encounter: Payer: Self-pay | Admitting: Obstetrics & Gynecology

## 2022-05-06 NOTE — Progress Notes (Signed)
No show

## 2022-06-22 ENCOUNTER — Encounter: Payer: Self-pay | Admitting: Obstetrics & Gynecology

## 2022-07-18 ENCOUNTER — Ambulatory Visit (INDEPENDENT_AMBULATORY_CARE_PROVIDER_SITE_OTHER): Payer: Self-pay | Admitting: Obstetrics & Gynecology

## 2022-07-18 ENCOUNTER — Encounter: Payer: Self-pay | Admitting: Obstetrics & Gynecology

## 2022-07-18 VITALS — BP 120/78 | HR 84 | Ht 63.5 in | Wt 225.0 lb

## 2022-07-18 DIAGNOSIS — Z30011 Encounter for initial prescription of contraceptive pills: Secondary | ICD-10-CM

## 2022-07-18 DIAGNOSIS — N96 Recurrent pregnancy loss: Secondary | ICD-10-CM

## 2022-07-18 MED ORDER — NORETHIN ACE-ETH ESTRAD-FE 1-20 MG-MCG(24) PO TABS: 1.0000 | ORAL_TABLET | Freq: Every day | ORAL | 4 refills | Status: AC

## 2022-07-18 NOTE — Progress Notes (Signed)
    Madison Contreras 09-26-1975 [id number]        47 y.o.  [id number] Married  RP: New patient consulting for recurrent spontaneous abortions  HPI: Had 2 normal pregnancies.  Sons are 23 and 65 yo.  Since then, 3 very early complete spontaneous abortions and 2 MAB after an Ob US showing a FHR. Never investigated. Not using contraception, but open to starting on BCPs given the risks associated with AMA of 47 yo.  Menses regular normal every month. LMP 06/24/22. No pelvic pain.  No BTB.  Overdue for Annual Gyn exam.  No recent Pap, no h/o abnormal Pap.       OB History  Gravida Para Term Preterm AB Living  7 2 2  0 5 2  SAB IAB Ectopic Multiple Live Births  5 0 0 0 2    # Outcome Date GA Lbr Len/2nd Weight Sex Delivery Anes PTL Lv  7 Term 01/26/05 [redacted]w[redacted]d   M Vag-Spont  N LIV  6 Term 01/26/99 [redacted]w[redacted]d  9 lb (4.082 kg) M Vag-Spont  N LIV     Complications: Gestational hypertension  5 SAB           4 SAB           3 SAB           2 SAB           1 SAB             Past medical history,surgical history, problem list, medications, allergies, family history and social history were all reviewed and documented in the EPIC chart.   Directed ROS with pertinent positives and negatives documented in the history of present illness/assessment and plan.  Exam:  Vitals:   07/18/22 1047  BP: 120/78  Pulse: 84  SpO2: 97%  Weight: 225 lb (102.1 kg)  Height: 5' 3.5" (1.613 m)   General appearance:  Normal  Abdomen: Normal  Gynecologic exam: Vulva normal.  Bimanual exam:  Uterus AV, normal volume, mobile, NT.  No adnexal mass, NT.   Assessment/Plan:  47 y.o. [id number]   1. History of recurrent spontaneous abortion, not currently pregnant Had 2 normal pregnancies.  Sons are 60 and 68 yo.  Since then, 3 very early complete spontaneous abortions and 2 MAB after an Ob US showing a FHR. Never investigated. Not using contraception, but open to starting on BCPs given the risks associated with AMA of 47 yo.   Menses regular normal every month. LMP 06/24/22. No pelvic pain.  No BTB.  Overdue for Annual Gyn exam.  No recent Pap, no h/o abnormal Pap.  Schedule Annual Gyn exam.  2. Encounter for initial prescription of contraceptive pills Given the risks associated with an AMA pregnancy at 47 yo.  Decision to start on BCPs.  Usage/risks/benefits reviewed.  Prescription sent to pharmacy.  Other orders - Norethindrone Acetate-Ethinyl Estrad-FE (LOESTRIN 24 FE) 1-20 MG-MCG(24) tablet; Take 1 tablet by mouth daily.   Genia Del MD, 11:07 AM 07/18/2022

## 2022-07-20 ENCOUNTER — Telehealth: Payer: Self-pay

## 2022-07-20 NOTE — Telephone Encounter (Signed)
Per JJ: she received the message CS sent also and reported that she called the pharmacies and had the prescription transferred that way. Will close this encounter.

## 2022-07-20 NOTE — Telephone Encounter (Signed)
-----   Message from Jerilynn Mages sent at 07/20/2022 11:39 AM EDT ----- Regarding: RX Patient stated RX needs to be sent to [facility] on [address] not [facility].
